# Patient Record
Sex: Female | Born: 1955 | Race: Black or African American | Hispanic: No | Marital: Single | Smoking: Never smoker
Health system: Southern US, Community
[De-identification: ages and names within clinical notes are randomized; demographics above are authoritative.]

## PROBLEM LIST (undated history)

## (undated) DIAGNOSIS — E119 Type 2 diabetes mellitus without complications: Secondary | ICD-10-CM

## (undated) DIAGNOSIS — I639 Cerebral infarction, unspecified: Secondary | ICD-10-CM

## (undated) DIAGNOSIS — I160 Hypertensive urgency: Secondary | ICD-10-CM

## (undated) HISTORY — PX: NO PAST SURGERIES: SHX2092

---

## 2017-05-14 ENCOUNTER — Encounter (HOSPITAL_COMMUNITY): Payer: Self-pay

## 2017-05-14 ENCOUNTER — Other Ambulatory Visit: Payer: Self-pay

## 2017-05-14 ENCOUNTER — Inpatient Hospital Stay (HOSPITAL_COMMUNITY)
Admission: EM | Admit: 2017-05-14 | Discharge: 2017-05-19 | DRG: 304 | Disposition: A | Discharge status: Home / Self Care | Payer: Self-pay | Attending: Family Medicine | Admitting: Family Medicine

## 2017-05-14 DIAGNOSIS — I160 Hypertensive urgency: Principal | ICD-10-CM | POA: Diagnosis present

## 2017-05-14 DIAGNOSIS — I10 Essential (primary) hypertension: Secondary | ICD-10-CM

## 2017-05-14 DIAGNOSIS — R51 Headache: Secondary | ICD-10-CM

## 2017-05-14 DIAGNOSIS — R739 Hyperglycemia, unspecified: Secondary | ICD-10-CM

## 2017-05-14 DIAGNOSIS — E1165 Type 2 diabetes mellitus with hyperglycemia: Secondary | ICD-10-CM | POA: Diagnosis present

## 2017-05-14 DIAGNOSIS — E611 Iron deficiency: Secondary | ICD-10-CM | POA: Diagnosis present

## 2017-05-14 DIAGNOSIS — G9389 Other specified disorders of brain: Secondary | ICD-10-CM | POA: Diagnosis present

## 2017-05-14 DIAGNOSIS — I129 Hypertensive chronic kidney disease with stage 1 through stage 4 chronic kidney disease, or unspecified chronic kidney disease: Secondary | ICD-10-CM | POA: Diagnosis present

## 2017-05-14 DIAGNOSIS — N185 Chronic kidney disease, stage 5: Secondary | ICD-10-CM | POA: Diagnosis present

## 2017-05-14 DIAGNOSIS — I6783 Posterior reversible encephalopathy syndrome: Secondary | ICD-10-CM | POA: Diagnosis present

## 2017-05-14 DIAGNOSIS — E1136 Type 2 diabetes mellitus with diabetic cataract: Secondary | ICD-10-CM | POA: Diagnosis present

## 2017-05-14 DIAGNOSIS — R519 Headache, unspecified: Secondary | ICD-10-CM

## 2017-05-14 DIAGNOSIS — H5462 Unqualified visual loss, left eye, normal vision right eye: Secondary | ICD-10-CM | POA: Diagnosis present

## 2017-05-14 DIAGNOSIS — R4182 Altered mental status, unspecified: Secondary | ICD-10-CM

## 2017-05-14 DIAGNOSIS — Z9114 Patient's other noncompliance with medication regimen: Secondary | ICD-10-CM

## 2017-05-14 DIAGNOSIS — Z8673 Personal history of transient ischemic attack (TIA), and cerebral infarction without residual deficits: Secondary | ICD-10-CM

## 2017-05-14 DIAGNOSIS — G40209 Localization-related (focal) (partial) symptomatic epilepsy and epileptic syndromes with complex partial seizures, not intractable, without status epilepticus: Secondary | ICD-10-CM | POA: Diagnosis present

## 2017-05-14 DIAGNOSIS — D649 Anemia, unspecified: Secondary | ICD-10-CM | POA: Diagnosis present

## 2017-05-14 DIAGNOSIS — H9190 Unspecified hearing loss, unspecified ear: Secondary | ICD-10-CM | POA: Diagnosis present

## 2017-05-14 DIAGNOSIS — E1122 Type 2 diabetes mellitus with diabetic chronic kidney disease: Secondary | ICD-10-CM | POA: Diagnosis present

## 2017-05-14 DIAGNOSIS — D329 Benign neoplasm of meninges, unspecified: Secondary | ICD-10-CM | POA: Diagnosis present

## 2017-05-14 DIAGNOSIS — R9401 Abnormal electroencephalogram [EEG]: Secondary | ICD-10-CM | POA: Diagnosis present

## 2017-05-14 HISTORY — DX: Cerebral infarction, unspecified: I63.9

## 2017-05-14 HISTORY — DX: Type 2 diabetes mellitus without complications: E11.9

## 2017-05-14 HISTORY — DX: Hypertensive urgency: I16.0

## 2017-05-14 LAB — COMPREHENSIVE METABOLIC PANEL
ALK PHOS: 59 U/L (ref 38–126)
ALT: 15 U/L (ref 14–54)
AST: 16 U/L (ref 15–41)
Albumin: 3.4 g/dL — ABNORMAL LOW (ref 3.5–5.0)
Anion gap: 10 (ref 5–15)
BILIRUBIN TOTAL: 0.9 mg/dL (ref 0.3–1.2)
BUN: 52 mg/dL — AB (ref 6–20)
CALCIUM: 9 mg/dL (ref 8.9–10.3)
CO2: 23 mmol/L (ref 22–32)
CREATININE: 1.63 mg/dL — AB (ref 0.44–1.00)
Chloride: 97 mmol/L — ABNORMAL LOW (ref 101–111)
GFR, EST AFRICAN AMERICAN: 38 mL/min — AB (ref >60)
GFR, EST NON AFRICAN AMERICAN: 33 mL/min — AB (ref >60)
Glucose, Bld: 385 mg/dL — ABNORMAL HIGH (ref 65–99)
Potassium: 4.4 mmol/L (ref 3.5–5.1)
Sodium: 130 mmol/L — ABNORMAL LOW (ref 135–145)
TOTAL PROTEIN: 8 g/dL (ref 6.5–8.1)

## 2017-05-14 LAB — CBC
HCT: 27.4 % — ABNORMAL LOW (ref 36.0–46.0)
Hemoglobin: 9.2 g/dL — ABNORMAL LOW (ref 12.0–15.0)
MCH: 27.7 pg (ref 26.0–34.0)
MCHC: 33.6 g/dL (ref 30.0–36.0)
MCV: 82.5 fL (ref 78.0–100.0)
PLATELETS: 386 10*3/uL (ref 150–400)
RBC: 3.32 MIL/uL — AB (ref 3.87–5.11)
RDW: 12.9 % (ref 11.5–15.5)
WBC: 9.2 10*3/uL (ref 4.0–10.5)

## 2017-05-14 LAB — CBG MONITORING, ED: Glucose-Capillary: 359 mg/dL — ABNORMAL HIGH (ref 65–99)

## 2017-05-14 MED ORDER — SODIUM CHLORIDE 0.9 % IV BOLUS (SEPSIS)
1000.0000 mL | Freq: Once | INTRAVENOUS | Status: AC
  Administered 2017-05-15: 1000 mL via INTRAVENOUS

## 2017-05-14 MED ORDER — FENTANYL CITRATE (PF) 100 MCG/2ML IJ SOLN
50.0000 ug | Freq: Once | INTRAMUSCULAR | Status: AC
  Administered 2017-05-15: 50 ug via INTRAVENOUS
  Filled 2017-05-14: qty 2

## 2017-05-14 MED ORDER — ONDANSETRON HCL 4 MG/2ML IJ SOLN
4.0000 mg | Freq: Once | INTRAMUSCULAR | Status: AC
  Administered 2017-05-15: 4 mg via INTRAVENOUS
  Filled 2017-05-14: qty 2

## 2017-05-14 MED ORDER — AMLODIPINE BESYLATE 5 MG PO TABS
5.0000 mg | ORAL_TABLET | Freq: Once | ORAL | Status: AC
  Administered 2017-05-15: 5 mg via ORAL
  Filled 2017-05-14: qty 1

## 2017-05-14 MED ORDER — INSULIN ASPART 100 UNIT/ML ~~LOC~~ SOLN
5.0000 [IU] | Freq: Once | SUBCUTANEOUS | Status: AC
  Administered 2017-05-15: 5 [IU] via SUBCUTANEOUS
  Filled 2017-05-14: qty 1

## 2017-05-14 NOTE — ED Triage Notes (Signed)
Onset  This morning headache.  Pt took Ibu with no relief.  Pt is visiting from [place], per daughter pt appears to be little confused today (pt confused exit door with bedroom door).  A&Ox 4.

## 2017-05-14 NOTE — ED Provider Notes (Signed)
TIME SEEN: 11:46 PM  CHIEF COMPLAINT: Headache, confusion  HPI: Patient is a 62 year old female with previous history of hypertension, diabetes, stroke who presents to the emergency department with her family for complaints of diffuse throbbing gradual onset headache that started this morning.  She presents here with her daughter who is also concerned because she has seemed confused today.  Patient is here visiting from [place].  She has been here for 2 months and plans to go home in March.  Patient denies any head injury.  Not on antiplatelets or anticoagulants.  She denies numbness, tingling or focal weakness.  Denies chest pain or shortness of breath.  Does report loss of vision in her left eye for the past 6 months and she feels like it seems "darker" for the past week.  No other vision changes.  No fevers, chills, cough, vomiting or diarrhea.  Tried ibuprofen without relief.  Patient is hypertensive and hyperglycemic here.  She states she is supposed to be on medication for hypertension and diabetes but left these medications in [place].  She cannot recall the name of these medications.  She also appears to have elevated kidney function here today.  She is not sure if this is chronic or not.  Daughter - Levada [phone]  ROS: See HPI Constitutional: no fever  Eyes: no drainage  ENT: no runny nose   Cardiovascular:  no chest pain  Resp: no SOB  GI: no vomiting GU: no dysuria Integumentary: no rash  Allergy: no hives  Musculoskeletal: no leg swelling  Neurological: no slurred speech ROS otherwise negative  PAST MEDICAL HISTORY/PAST SURGICAL HISTORY:  Past Medical History:  Diagnosis Date  . Diabetes mellitus without complication ([place])   . Stroke [facility])     MEDICATIONS:  Prior to Admission medications   Not on File    ALLERGIES:  No Known Allergies  SOCIAL HISTORY:  Social History   Tobacco Use  . Smoking status: Never Smoker  . Smokeless tobacco: Never Used  Substance  Use Topics  . Alcohol use: No    Frequency: Never    FAMILY HISTORY: History reviewed. No pertinent family history.  EXAM: BP (!) 190/97   Pulse 97   Temp 98.7 F (37.1 C)   Resp 14   SpO2 100%  CONSTITUTIONAL: Alert and oriented x3 and responds appropriately to questions.  Thin, elderly, poor historian, hard of hearing HEAD: Normocephalic, atraumatic EYES: Conjunctivae clear, pupils appear equal, EOMI, reports decreased vision in the left eye compared to the right but patient has a hard time understanding commands to accurately check visual fields ENT: normal nose; moist mucous membranes NECK: Supple, no meningismus, no nuchal rigidity, no LAD  CARD: RRR; S1 and S2 appreciated; no murmurs, no clicks, no rubs, no gallops RESP: Normal chest excursion without splinting or tachypnea; breath sounds clear and equal bilaterally; no wheezes, no rhonchi, no rales, no hypoxia or respiratory distress, speaking full sentences ABD/GI: Normal bowel sounds; non-distended; soft, non-tender, no rebound, no guarding, no peritoneal signs, no hepatosplenomegaly BACK:  The back appears normal and is non-tender to palpation, there is no CVA tenderness EXT: Normal ROM in all joints; non-tender to palpation; no edema; normal capillary refill; no cyanosis, no calf tenderness or swelling    SKIN: Normal color for age and race; warm; no rash NEURO: Moves all extremities equally, strength 5/5 in all 4 extremities but she does have difficulty figuring out how to follow commands with the left upper extremity, sensation to light touch  intact diffusely, cranial nerves II through XII intact, no dysmetria to finger-nose testing bilaterally, normal speech PSYCH: The patient's mood and manner are appropriate. Grooming and personal hygiene are appropriate.  MEDICAL DECISION MAKING: Patient here with gradual onset throbbing frontal headache and acute on chronic vision changes and confusion.  Labs show elevated creatinine of  1.63, hyperglycemia with glucose of 385 but not in DKA.  She also also anemic with hemoglobin of 9.2.  No leukocytosis.  I do not think this is meningitis or encephalitis.  I am concerned that she could have acute stroke.  Unable to determine the last seen normal.  Seems like symptoms of headache started this morning as well as confusion but vision changes have been worsening for the past 6 months.  Will obtain head CT and give pain and nausea medicine.  Will also give medication for blood pressure and insulin to bring down her blood glucose.  ED PROGRESS: Head CT current starting for multiple areas of low attenuation that could represent stroke.  Recommend MRI of the brain.  Will discuss with neurology.  Blood pressure slowly improving.  Allowing for permissive hypertension in case this is stroke.   1:12 AM  D/w Dr. Cheral Marker with neurology.  He will see patient in consult.  He agrees with MRI of the brain without contrast.  He is concerned this could be PRES. recommends more aggressive blood pressure control.  Will give IV hydralazine.  Patient reports her headache is improved with fentanyl.  Will discuss with medicine for admission for control of blood pressure, hyperglycemia and admission for possible PRES versus CVA.   1:28 AM Discussed patient's case with hospitalist, Dr. Hal Hope.  I have recommended admission and patient (and family if present) agree with this plan. Admitting physician will place admission orders.   I reviewed all nursing notes, vitals, pertinent previous records, EKGs, lab and urine results, imaging (as available).     EKG Interpretation  Date/Time:  Wednesday May 15 2017 00:53:41 EST Ventricular Rate:  97 PR Interval:    QRS Duration: 79 QT Interval:  346 QTC Calculation: 440 R Axis:   60 Text Interpretation:  Sinus rhythm Biatrial enlargement Anterior infarct, old No old tracing to compare Confirmed by Juliene Kirsh, Cyril Mourning [phone]) on 05/15/2017 1:13:22 AM          Vannie Hilgert, Delice Bison, DO 05/15/17 0129

## 2017-05-15 ENCOUNTER — Encounter (HOSPITAL_COMMUNITY): Payer: Self-pay | Admitting: Radiology

## 2017-05-15 ENCOUNTER — Observation Stay (HOSPITAL_COMMUNITY): Payer: Self-pay

## 2017-05-15 ENCOUNTER — Emergency Department (HOSPITAL_COMMUNITY): Payer: Self-pay

## 2017-05-15 DIAGNOSIS — R4182 Altered mental status, unspecified: Secondary | ICD-10-CM

## 2017-05-15 DIAGNOSIS — E1165 Type 2 diabetes mellitus with hyperglycemia: Secondary | ICD-10-CM | POA: Diagnosis present

## 2017-05-15 DIAGNOSIS — D649 Anemia, unspecified: Secondary | ICD-10-CM | POA: Diagnosis present

## 2017-05-15 DIAGNOSIS — I160 Hypertensive urgency: Secondary | ICD-10-CM

## 2017-05-15 HISTORY — DX: Hypertensive urgency: I16.0

## 2017-05-15 LAB — HEMOGLOBIN A1C
Hgb A1c MFr Bld: 13.3 % — ABNORMAL HIGH (ref 4.8–5.6)
Mean Plasma Glucose: 335.01 mg/dL

## 2017-05-15 LAB — RETICULOCYTES
RBC.: 2.95 MIL/uL — ABNORMAL LOW (ref 3.87–5.11)
RETIC CT PCT: 1.4 % (ref 0.4–3.1)
Retic Count, Absolute: 41.3 10*3/uL (ref 19.0–186.0)

## 2017-05-15 LAB — CBG MONITORING, ED
GLUCOSE-CAPILLARY: 264 mg/dL — AB (ref 65–99)
GLUCOSE-CAPILLARY: 211 mg/dL — AB (ref 65–99)
GLUCOSE-CAPILLARY: 279 mg/dL — AB (ref 65–99)
Glucose-Capillary: 351 mg/dL — ABNORMAL HIGH (ref 65–99)
Glucose-Capillary: 244 mg/dL — ABNORMAL HIGH (ref 65–99)

## 2017-05-15 LAB — BASIC METABOLIC PANEL
ANION GAP: 7 (ref 5–15)
BUN: 40 mg/dL — ABNORMAL HIGH (ref 6–20)
CO2: 20 mmol/L — AB (ref 22–32)
Calcium: 8.7 mg/dL — ABNORMAL LOW (ref 8.9–10.3)
Chloride: 109 mmol/L (ref 101–111)
Creatinine, Ser: 1.33 mg/dL — ABNORMAL HIGH (ref 0.44–1.00)
GFR calc Af Amer: 49 mL/min — ABNORMAL LOW (ref >60)
GFR, EST NON AFRICAN AMERICAN: 42 mL/min — AB (ref >60)
GLUCOSE: 188 mg/dL — AB (ref 65–99)
POTASSIUM: 4.1 mmol/L (ref 3.5–5.1)
Sodium: 136 mmol/L (ref 135–145)

## 2017-05-15 LAB — SODIUM, URINE, RANDOM: SODIUM UR: 38 mmol/L

## 2017-05-15 LAB — URINALYSIS, ROUTINE W REFLEX MICROSCOPIC
Bacteria, UA: NONE SEEN
Bilirubin Urine: NEGATIVE
Glucose, UA: >=500 mg/dL — AB
KETONES UR: 5 mg/dL — AB
Leukocytes, UA: NEGATIVE
Nitrite: NEGATIVE
PROTEIN: 100 mg/dL — AB
Specific Gravity, Urine: 1.014 (ref 1.005–1.030)
pH: 5 (ref 5.0–8.0)

## 2017-05-15 LAB — FERRITIN: FERRITIN: 164 ng/mL (ref 11–307)

## 2017-05-15 LAB — HIV ANTIBODY (ROUTINE TESTING W REFLEX): HIV Screen 4th Generation wRfx: NONREACTIVE

## 2017-05-15 LAB — CREATININE, URINE, RANDOM: CREATININE, URINE: 43.79 mg/dL

## 2017-05-15 LAB — IRON AND TIBC
IRON: 37 ug/dL (ref 28–170)
Saturation Ratios: 15 % (ref 10.4–31.8)
TIBC: 244 ug/dL — AB (ref 250–450)
UIBC: 207 ug/dL

## 2017-05-15 LAB — FOLATE: Folate: 21 ng/mL (ref >5.9)

## 2017-05-15 LAB — SEDIMENTATION RATE: Sed Rate: 80 mm/hr — ABNORMAL HIGH (ref 0–22)

## 2017-05-15 LAB — VITAMIN B12: Vitamin B-12: 996 pg/mL — ABNORMAL HIGH (ref 180–914)

## 2017-05-15 MED ORDER — ACETAMINOPHEN 160 MG/5ML PO SOLN: 650.0000 mg | ORAL | Status: DC | PRN

## 2017-05-15 MED ORDER — ONDANSETRON HCL 4 MG/2ML IJ SOLN
4.0000 mg | INTRAMUSCULAR | Status: DC | PRN
  Administered 2017-05-15: 4 mg via INTRAVENOUS
  Filled 2017-05-15: qty 2

## 2017-05-15 MED ORDER — INSULIN ASPART 100 UNIT/ML ~~LOC~~ SOLN
0.0000 [IU] | SUBCUTANEOUS | Status: DC
  Administered 2017-05-15: 5 [IU] via SUBCUTANEOUS
  Filled 2017-05-15: qty 1

## 2017-05-15 MED ORDER — INSULIN ASPART 100 UNIT/ML ~~LOC~~ SOLN
0.0000 [IU] | Freq: Three times a day (TID) | SUBCUTANEOUS | Status: DC
  Administered 2017-05-15: 3 [IU] via SUBCUTANEOUS
  Administered 2017-05-15: 5 [IU] via SUBCUTANEOUS
  Administered 2017-05-16: 3 [IU] via SUBCUTANEOUS
  Administered 2017-05-16: 2 [IU] via SUBCUTANEOUS
  Administered 2017-05-17: 3 [IU] via SUBCUTANEOUS
  Administered 2017-05-17: 2 [IU] via SUBCUTANEOUS
  Administered 2017-05-18: 1 [IU] via SUBCUTANEOUS
  Administered 2017-05-18: 3 [IU] via SUBCUTANEOUS
  Administered 2017-05-18: 2 [IU] via SUBCUTANEOUS
  Administered 2017-05-19 (×3): 1 [IU] via SUBCUTANEOUS
  Filled 2017-05-15 (×2): qty 1

## 2017-05-15 MED ORDER — ACETAMINOPHEN 650 MG RE SUPP: 650.0000 mg | RECTAL | Status: DC | PRN

## 2017-05-15 MED ORDER — LEVETIRACETAM 500 MG/5ML IV SOLN
1000.0000 mg | Freq: Once | INTRAVENOUS | Status: AC
  Administered 2017-05-15: 1000 mg via INTRAVENOUS
  Filled 2017-05-15: qty 10

## 2017-05-15 MED ORDER — ACETAMINOPHEN 325 MG PO TABS
650.0000 mg | ORAL_TABLET | ORAL | Status: DC | PRN
  Filled 2017-05-15: qty 2

## 2017-05-15 MED ORDER — SODIUM CHLORIDE 0.9 % IV SOLN
500.0000 mg | Freq: Two times a day (BID) | INTRAVENOUS | Status: DC
  Administered 2017-05-16 – 2017-05-17 (×3): 500 mg via INTRAVENOUS
  Filled 2017-05-15 (×3): qty 5

## 2017-05-15 MED ORDER — HYDRALAZINE HCL 20 MG/ML IJ SOLN
10.0000 mg | Freq: Once | INTRAMUSCULAR | Status: AC
  Administered 2017-05-15: 10 mg via INTRAVENOUS
  Filled 2017-05-15: qty 1

## 2017-05-15 MED ORDER — INSULIN GLARGINE 100 UNIT/ML ~~LOC~~ SOLN
10.0000 [IU] | Freq: Every day | SUBCUTANEOUS | Status: DC
  Administered 2017-05-15: 10 [IU] via SUBCUTANEOUS
  Filled 2017-05-15 (×2): qty 0.1

## 2017-05-15 MED ORDER — HYDRALAZINE HCL 20 MG/ML IJ SOLN: 10.0000 mg | INTRAMUSCULAR | Status: DC | PRN

## 2017-05-15 MED ORDER — SODIUM CHLORIDE 0.9 % IV SOLN
INTRAVENOUS | Status: DC
Start: 2017-05-15 — End: 2017-05-16
  Administered 2017-05-15 (×2): via INTRAVENOUS

## 2017-05-15 MED ORDER — AMLODIPINE BESYLATE 5 MG PO TABS
5.0000 mg | ORAL_TABLET | Freq: Every day | ORAL | Status: DC
  Administered 2017-05-15: 5 mg via ORAL
  Filled 2017-05-15: qty 1

## 2017-05-15 NOTE — ED Notes (Signed)
Patient transported to CT 

## 2017-05-15 NOTE — Progress Notes (Signed)
Patient admitted after midnight, please see H&P.  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  Visual issues have been ongoing for 6 months but worsened over 1 month-- suspect related to uncontrolled blood sugars.  Will see about outpatient opthalmology evaluation.  Was on both BP meds and DM meds in [place] but stopped when came to the [place].  Spoke with daughter here who is not sure what medications patient was supposed to be taking but will contact her sister to find out.  Eulogio Bear DO

## 2017-05-15 NOTE — ED Notes (Signed)
CBG 279 

## 2017-05-15 NOTE — Evaluation (Signed)
Physical Therapy Evaluation Patient Details Name: Linda Dennis MRN: [id number] DOB: 08/19/1955 Today's Date: 05/15/2017   History of Present Illness  Pt is a 62 y/o female admited secondary to hypertensive urgency. Pt with increased confusion. 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 Also revealed R temporal occipital meningioma. PMH includes CVA, L vision loss, and DM.   Clinical Impression  Pt admitted secondary to problem above with deficits below. PTA, pt reports she needed occasional assist for mobility secondary to L eye visual deficits, however, did not use AD. Upon eval, pt presenting with decreased balance, weakness, and L eye visual deficits. Required min guard to min A for mobility without AD. Educated about use of RW at home to increase independence with functional mobility. Pt would benefit from HHPT, however, unsure if pt will be eligible. Unsure of family assist able to be provided at home, so will need to ensure family can provide assist as well. Will continue to follow acutely to maximize functional mobility independence and safety.     Follow Up Recommendations Supervision for mobility/OOB;Other (comment)(would benefit from HHPT; unsure if pt is eligible )    Equipment Recommendations  Rolling walker with 5" wheels;3in1 (PT)    Recommendations for Other Services       Precautions / Restrictions Precautions Precautions: Fall Restrictions Weight Bearing Restrictions: No      Mobility  Bed Mobility Overal bed mobility: Modified Independent             General bed mobility comments: Increased time, however, no assist required.   Transfers Overall transfer level: Needs assistance Equipment used: None Transfers: Sit to/from Stand Sit to Stand: Min guard         General transfer comment: Min guard for steadying assist.   Ambulation/Gait Ambulation/Gait assistance: Min guard;Min assist Ambulation Distance (Feet):  125 Feet Assistive device: None Gait Pattern/deviations: Step-through pattern;Decreased stride length;Drifts right/left Gait velocity: Decreased Gait velocity interpretation: Below normal speed for age/gender General Gait Details: Slow, unsteady gait without use of AD. Required min to min guard assist for mobility. Educated about using RW at home to increase safety with mobility. Pt with L sided visual deficits as well, so required directional cues to avoid obstacles on the L.   Stairs            Wheelchair Mobility    Modified Rankin (Stroke Patients Only) Modified Rankin (Stroke Patients Only) Pre-Morbid Rankin Score: Slight disability Modified Rankin: Moderately severe disability     Balance Overall balance assessment: Needs assistance Sitting-balance support: No upper extremity supported;Feet supported Sitting balance-Leahy Scale: Good     Standing balance support: No upper extremity supported;During functional activity Standing balance-Leahy Scale: Fair                               Pertinent Vitals/Pain Pain Assessment: Faces Faces Pain Scale: Hurts little more Pain Location: head  Pain Descriptors / Indicators: Headache Pain Intervention(s): Limited activity within patient's tolerance;Monitored during session;Repositioned    Home Living Family/patient expects to be discharged to:: Private residence Living Arrangements: Children Available Help at Discharge: Family Type of Home: Apartment Home Access: Stairs to enter Entrance Stairs-Rails: Chemical engineer of Steps: flight  Home Layout: One level Home Equipment: None Additional Comments: Pt lives in [place], however, is staying with her children while she is here. Unsure of specific details and family assist able to be provided upon  d/c as no family present.     Prior Function Level of Independence: Needs assistance   Gait / Transfers Assistance Needed: Pt reports she needed  some help getting around secondary to L visual deficits, however, did not use AD.            Hand Dominance        Extremity/Trunk Assessment   Upper Extremity Assessment Upper Extremity Assessment: Defer to OT evaluation    Lower Extremity Assessment Lower Extremity Assessment: Generalized weakness    Cervical / Trunk Assessment Cervical / Trunk Assessment: Kyphotic  Communication   Communication: No difficulties  Cognition Arousal/Alertness: Awake/alert Behavior During Therapy: WFL for tasks assessed/performed Overall Cognitive Status: No family/caregiver present to determine baseline cognitive functioning                                        General Comments General comments (skin integrity, edema, etc.): No family present. Will need to follow up with family to ensure they are able to provide assist at home.     Exercises     Assessment/Plan    PT Assessment Patient needs continued PT services  PT Problem List Decreased strength;Decreased balance;Decreased mobility;Decreased knowledge of use of DME;Decreased knowledge of precautions;Pain       PT Treatment Interventions Gait training;Stair training;Functional mobility training;Therapeutic activities;DME instruction;Therapeutic exercise;Balance training;Neuromuscular re-education;Patient/family education    PT Goals (Current goals can be found in the Care Plan section)  Acute Rehab PT Goals Patient Stated Goal: none stated  PT Goal Formulation: With patient Time For Goal Achievement: 05/29/17 Potential to Achieve Goals: Good    Frequency Min 3X/week   Barriers to discharge Other (comment) unsure of caregiver support.     Co-evaluation               AM-PAC PT "6 Clicks" Daily Activity  Outcome Measure Difficulty turning over in bed (including adjusting bedclothes, sheets and blankets)?: None Difficulty moving from lying on back to sitting on the side of the bed? : A  Little Difficulty sitting down on and standing up from a chair with arms (e.g., wheelchair, bedside commode, etc,.)?: Unable Help needed moving to and from a bed to chair (including a wheelchair)?: A Little Help needed walking in hospital room?: A Little Help needed climbing 3-5 steps with a railing? : A Lot 6 Click Score: 16    End of Session Equipment Utilized During Treatment: Gait belt Activity Tolerance: Patient tolerated treatment well Patient left: in bed;with call bell/phone within reach Nurse Communication: Mobility status PT Visit Diagnosis: Unsteadiness on feet (R26.81);Other abnormalities of gait and mobility (R26.89);Muscle weakness (generalized) (M62.81)    Time: 1643-1700 PT Time Calculation (min) (ACUTE ONLY): 17 min   Charges:   PT Evaluation $PT Eval Moderate Complexity: 1 Mod     PT G Codes:        Leighton Ruff, PT, DPT  Acute Rehabilitation Services  Pager: [phone]   Rudean Hitt 05/15/2017, 5:28 PM

## 2017-05-15 NOTE — ED Notes (Signed)
Ordered breakfast tray  

## 2017-05-15 NOTE — Procedures (Signed)
ELECTROENCEPHALOGRAM REPORT  Date of Study: 05/15/2017  Patient's Name: Linda Dennis MRN: [id number] Date of Birth: February 10, 1956  Referring Provider: Dr. Kerney Elbe  Clinical History: This is a 62 year old woman with altered mental status, headache, vision loss, veering to the left.  Medications: Tylenol Norvasc Hydralazine Insulin Zofran  Technical Summary: A multichannel digital EEG recording measured by the international 10-20 system with electrodes applied with paste and impedances below 5000 ohms performed as portable with EKG monitoring in an awake and drowsy patient.  Hyperventilation was not performed. Photic stimulation was performed.  The digital EEG was referentially recorded, reformatted, and digitally filtered in a variety of bipolar and referential montages for optimal display.   Description: The patient is awake and drowsy during the recording.  During maximal wakefulness, there is an asymmetric, medium voltage 7 Hz posterior dominant rhythm better formed over the left hemisphere, that attenuates with eye opening. This is admixed with a moderate amount of diffuse 5-6 Hz theta slowing of the waking background. There is additional focal slowing and loss of faster frequencies over the right hemisphere. There are lateralized periodic discharges (LPDs) seen over the right hemisphere, maximal over the right frontocentral region, at times with spread to the left hemisphere. LPDs occur every 1-3 second, with no evolution in frequency or amplitude. Normal sleep architecture was not seen. Photic stimulation did not elicit any abnormalities.  There were no electrographic seizures seen.    EKG lead showed sinus tachycardia.  Impression: This awake and drowsy EEG is abnormal due to the presence of: 1. Mild diffuse slowing of the waking background 2. Additional focal slowing over the right hemisphere 3. Lateralized periodic discharges over the right hemisphere, maximal over the right  frontocentral region  Clinical Correlation of the above findings indicates diffuse cerebral dysfunction that is non-specific in etiology and can be seen with hypoxic/ischemic injury, toxic/metabolic encephalopathies, neurodegenerative disorders, or medication effect. Focal slowing over the right hemisphere indicates focal cerebral dysfunction in this regions, suggestive of underlying structural or physiologic abnormality. Although lateralized periodic discharges do not represent ongoing seizure activity, they are commonly associated with an acute brain lesion and clinical focal seizures, or post-ictal after focal status epilepticus.    Ellouise Newer, M.D.

## 2017-05-15 NOTE — ED Notes (Signed)
Pt is alert and oriented and transferred to bed.  Pt set up to eat.  Pt on monitor. NS at 75.  Pt follows commands.  Alert to self and in hospital.

## 2017-05-15 NOTE — H&P (Signed)
History and Physical    Linda Dennis WRU:[id number] DOB: [birth year] DOA: 05/14/2017  PCP: Patient, No Pcp Per  Patient coming from: Home.  Chief Complaint: Headache.  HPI: Linda Dennis is a 62 y.o. female with history of hypertension diabetes mellitus who originally resides in [place] is visiting her family and has been in the [place] since March 16, 2017.  Patient has not brought her medications from home.  And has not taken any of her medications for last 2 months.  Since yesterday morning patient states she has been having a frontal headache throbbing in nature with some nausea vomiting.  At times she felt weak in the extremities but has not lost function of the extremities.  Patient also states that she has poor vision in the left eye.  As per the daughter who discussed with the ER physician patient also looked confused.  ED Course: On arrival patient's blood pressure was more than 914 systolic with labs showing blood sugar in the 300 range.  Not in DKA.  CT head is unremarkable and patient on exam has poor vision on the left eye which patient states is chronic and also has a cataract in the left eye.  Patient denies any chest pain or shortness of breath.  Is able to move all extremities.  On-call neurology has been consulted.  Since her main concern is patient may be having hypertensive urgency patient was given amlodipine followed by hydralazine.  Review of Systems: As per HPI, rest all negative.   Past Medical History:  Diagnosis Date  . Diabetes mellitus without complication ([place])   . Stroke [facility])     History reviewed. No pertinent surgical history.   reports that  has never smoked. she has never used smokeless tobacco. She reports that she does not drink alcohol or use drugs.  No Known Allergies  Family History  Family history unknown: Yes    Prior to Admission medications   Not on File    Physical Exam: Vitals:   05/15/17 0030 05/15/17 0045 05/15/17 0115 05/15/17 0130    BP: (!) 172/95 (!) 170/94 (!) 185/95 (!) 179/100  Pulse: 99 97 (!) 101 99  Resp: 16 17 16 19   Temp:      TempSrc:      SpO2: 100% 100% 100% 100%      Constitutional: Moderately built and nourished. Vitals:   05/15/17 0030 05/15/17 0045 05/15/17 0115 05/15/17 0130  BP: (!) 172/95 (!) 170/94 (!) 185/95 (!) 179/100  Pulse: 99 97 (!) 101 99  Resp: 16 17 16 19   Temp:      TempSrc:      SpO2: 100% 100% 100% 100%   Eyes: Anicteric no pallor.  Left eye has a cataract. ENMT: No discharge from the ears eyes nose or mouth. Neck: No mass felt.  No neck rigidity. Respiratory: No rhonchi or crepitations. Cardiovascular: S1-S2 heard. Abdomen: Soft nontender bowel sounds present. Musculoskeletal: No edema.  No joint effusion. Skin: No rash.  Skin appears warm. Neurologic: Alert awake oriented to time place and person.  Moves all extremities 5 x 5.  No facial asymmetry.  Tongue is midline.  Poor vision on the left eye.  Pupils are reacting.  Psychiatric: Appears normal.   Labs on Admission: I have personally reviewed following labs and imaging studies  CBC: Recent Labs  Lab 05/14/17 2015  WBC 9.2  HGB 9.2*  HCT 27.4*  MCV 82.5  PLT 782   Basic Metabolic Panel: Recent Labs  Lab  05/14/17 2015  NA 130*  K 4.4  CL 97*  CO2 23  GLUCOSE 385*  BUN 52*  CREATININE 1.63*  CALCIUM 9.0   GFR: CrCl cannot be calculated (Unknown ideal weight.). Liver Function Tests: Recent Labs  Lab 05/14/17 2015  AST 16  ALT 15  ALKPHOS 59  BILITOT 0.9  PROT 8.0  ALBUMIN 3.4*   No results for input(s): LIPASE, AMYLASE in the last 168 hours. No results for input(s): AMMONIA in the last 168 hours. Coagulation Profile: No results for input(s): INR, PROTIME in the last 168 hours. Cardiac Enzymes: No results for input(s): CKTOTAL, CKMB, CKMBINDEX, TROPONINI in the last 168 hours. BNP (last 3 results) No results for input(s): PROBNP in the last 8760 hours. HbA1C: No results for input(s):  HGBA1C in the last 72 hours. CBG: Recent Labs  Lab 05/14/17 2010 05/15/17 0106  GLUCAP 359* 351*   Lipid Profile: No results for input(s): CHOL, HDL, LDLCALC, TRIG, CHOLHDL, LDLDIRECT in the last 72 hours. Thyroid Function Tests: No results for input(s): TSH, T4TOTAL, FREET4, T3FREE, THYROIDAB in the last 72 hours. Anemia Panel: No results for input(s): VITAMINB12, FOLATE, FERRITIN, TIBC, IRON, RETICCTPCT in the last 72 hours. Urine analysis: No results found for: COLORURINE, APPEARANCEUR, LABSPEC, PHURINE, GLUCOSEU, HGBUR, BILIRUBINUR, KETONESUR, PROTEINUR, UROBILINOGEN, NITRITE, LEUKOCYTESUR Sepsis Labs: @LABRCNTIP (procalcitonin:4,lacticidven:4) )No results found for this or any previous visit (from the past 240 hour(s)).   Radiological Exams on Admission: Ct Head Wo Contrast  Result Date: 05/15/2017 CLINICAL DATA:  Headache beginning this morning. Confusion. History of stroke and diabetes. EXAM: CT HEAD WITHOUT CONTRAST TECHNIQUE: Contiguous axial images were obtained from the base of the skull through the vertex without intravenous contrast. COMPARISON:  None. FINDINGS: Brain: Mild cerebral atrophy. No ventricular dilatation. Low-attenuation changes in the deep white matter are nonspecific but likely due to small vessel ischemia. Focal areas of low-attenuation demonstrated in a right posterior parietal lobe and left frontal and parietal lobes. These could represent old infarcts but margins are not well defined and acute infarct and 1 or more locations could also be present. MRI would be more sensitive for evaluation of acute ischemia. No mass effect or midline shift. No abnormal extra-axial fluid collections. Gray-white matter junctions are distinct. Basal cisterns are not effaced. No acute intracranial hemorrhage. There is a nodular extra-axial calcification along the right posterior parietal dural surface measuring 1.5 cm diameter, likely representing a calcified meningioma. Vascular:  Internal carotid artery vascular calcifications are present. Skull: Normal. Negative for fracture or focal lesion. Sinuses/Orbits: No acute finding. 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 IMPRESSION: 1. Focal areas of low-attenuation in the right posterior parietal lobe and left frontal and parietal lobes. These could represent acute or chronic ischemic changes. MRI suggested for more definitive evaluation if clinically indicated. 2. No acute intracranial hemorrhage. 3. Mild cerebral atrophy and small vessel ischemic changes. 4. 15 mm right parietal calcified meningioma. Electronically Signed   By: Lucienne Capers M.D.   On: 05/15/2017 00:21    EKG: Independently reviewed.  Normal sinus rhythm.  Assessment/Plan Active Problems:   Hypertensive urgency   Uncontrolled type 2 diabetes mellitus with hyperglycemia (HCC)   Normocytic normochromic anemia    1. Hypertensive urgency -patient has been started on amlodipine 5 mg and as needed IV hydralazine for now.  Closely follow blood pressure trends.  In the morning once patient's family is available will need to check if we can get patient's home medication doses.  MRI  brain is pending to rule out any stroke. 2. Diabetes mellitus type 2 uncontrolled -patient was given 8 units of NovoLog subcu in the ER and I have placed patient on sliding scale coverage.  Check hemoglobin A1c and follow sliding scale coverage.  Need to check home medication doses. 3. Headache probably from uncontrolled blood pressure -presently has improved.  Closely monitor blood pressure trends and any worsening of headache.  MRI brain is pending to rule out any stroke.  Will check sed rate. 4. Normocytic normochromic anemia -no old labs to compare.  Follow CBC and anemia panel. 5. Elevated creatinine probably chronic kidney disease -again no old labs to compare.  Follow UA and FENa.  Follow metabolic panel.  Patient is a poor historian.  Will need to  discuss with patient's daughter in the morning again to get a more detailed history. I did discuss with on-call neurologist.   DVT prophylaxis: SCDs until blood pressure is controlled. Code Status: Full code. Family Communication: Discussed with patient. Disposition Plan: Home. Consults called: Neurology. Admission status: Observation.   Rise Patience MD [facility] Pager [phone].  If 7PM-7AM, please contact night-coverage [email or web address] Password [email or web address]  05/15/2017, 1:55 AM

## 2017-05-15 NOTE — Progress Notes (Signed)
EEG complete - results pending 

## 2017-05-15 NOTE — ED Notes (Signed)
Patient transported to MRI 

## 2017-05-15 NOTE — Consult Note (Signed)
NEURO HOSPITALIST CONSULT NOTE   Requestig physician: Dr. Leonides Schanz  Reason for Consult: Confusion, headache, vision loss  History obtained from:  Patient, Granddaughter and Chart     HPI:                                                                                                                                          Linda Dennis is an 62 y.o. female who presents with a 1 day history of confusion, severe headache (it made her cry), vision loss and veering to the left when walking. On presentation to the ED, her BP was elevated at 181/96. She denies having a prior history of stroke. She is visiting family from [place] and has been here for 2 weeks. Before leaving [place] she was on one or more antihypertensive medications; she states that she did not bring them with her to the [place] At baseline she walks normally and converses without confusion or aphasia as indicated by normal behavior while on shopping trips with family over the past two weeks - the above symptoms are definitely new as per granddaughter she has been observed to be normal for the past 2 weeks prior to deteriorating the day PTA.   Hospitalist note was reviewed: "Patient is a 62 year old female with previous history of hypertension, diabetes, stroke who presents to the emergency department with her family for complaints of diffuse throbbing gradual onset headache that started this morning.  She presents here with her daughter who is also concerned because she has seemed confused today.  Patient is here visiting from [place].  She has been here for 2 months and plans to go home in March.  Patient denies any head injury.  Not on antiplatelets or anticoagulants.  She denies numbness, tingling or focal weakness.  Denies chest pain or shortness of breath.  Does report loss of vision in her left eye for the past 6 months and she feels like it seems "darker" for the past week.  No other vision changes.  No fevers, chills,  cough, vomiting or diarrhea.  Tried ibuprofen without relief.  Patient is hypertensive and hyperglycemic here.  She states she is supposed to be on medication for hypertension and diabetes but left these medications in [place].  She cannot recall the name of these medications.  She also appears to have elevated kidney function here today.  She is not sure if this is chronic or not."  MRI brain: 1. No acute intracranial abnormality. 2. 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 3. Patchy white matter changes involving the supratentorial cerebral white matter, nonspecific, but most commonly related to chronic small vessel ischemic disease. 4. 13 mm right temporal occipital meningioma without associated mass effect.  Past Medical History:  Diagnosis Date  . Diabetes mellitus without complication ([place])   . Stroke [facility])     History reviewed. No pertinent surgical history.  History reviewed. No pertinent family history.  Social History:  reports that  has never smoked. she has never used smokeless tobacco. She reports that she does not drink alcohol or use drugs.  No Known Allergies  MEDICATIONS:                                                                                                                     Prior to Admission:  (Not in a hospital admission) Scheduled: . amLODipine  5 mg Oral Daily  . insulin aspart  0-9 Units Subcutaneous TID WC     ROS:                                                                                                                                       As per HPI.    Blood pressure (!) 170/94, pulse 97, temperature 98.7 F (37.1 C), resp. rate 17, SpO2 100 %.   General Examination:                                                                                                      HEENT-  [place]/AT    Lungs- Respirations unlabored Extremities- No edema  Neurological  Examination Mental Status: Alert, fully oriented, thought content appropriate. Speech fluent in the context of [place] accent and native language being a mixed Creole-English variation per granddaughter. Able to follow all right sided commands without difficulty but has difficulty attending to and following left sided commands. Mild left hemineglect.  Cranial Nerves: II: PERRL. Sees only dark OS. Right eye with left hemifield deficit and decreased acuity in the right hemifield.  III,IV, VI: Squints left eye but able to fully open. Initial EOM testing normal. Subsequently with about 20 seconds of coarse left-beating nystagmus than did not change when gazing to right, left or vertically; this  may have been accompanied by transient worsening of patient's ability to follow commands. This then resolved with normal EOM again seen.  V,VII: Smile symmetric, facial temp and FT sensation normal bilaterally without extinction to DSS.  VIII: Hearing intact to voice IX,X: no hypophonia or hoarseness XI: Lag on left with shoulder shrug XII: midline tongue extension Motor: RUE and RLE: Normal response to motor commands with 5/5 strength proximal and distal LUE and LLE: Left hemineglect when asked to perform movements, requiring frequent repetition of commands and redirection. Left arm drifts when held horizontally. Significant lag on left when asked to raise both arms simultaneously. 4-/5 to 4+/5 strength LUE and LLE on separate trials.  Sensory: Temp sensation intact all 4 extremities. FT decreased LUE and LLE with extinction to DSS.  Deep Tendon Reflexes:  1+ bilateral upper extremities.  Hypoactive lower extremities. Cerebellar: No ataxia with FNF on right. Has difficulty performing on left.  Gait: Deferred  Lab Results: Basic Metabolic Panel: Recent Labs  Lab 05/14/17 2015  NA 130*  K 4.4  CL 97*  CO2 23  GLUCOSE 385*  BUN 52*  CREATININE 1.63*  CALCIUM 9.0    Liver Function Tests: Recent  Labs  Lab 05/14/17 2015  AST 16  ALT 15  ALKPHOS 59  BILITOT 0.9  PROT 8.0  ALBUMIN 3.4*   No results for input(s): LIPASE, AMYLASE in the last 168 hours. No results for input(s): AMMONIA in the last 168 hours.  CBC: Recent Labs  Lab 05/14/17 2015  WBC 9.2  HGB 9.2*  HCT 27.4*  MCV 82.5  PLT 386    Cardiac Enzymes: No results for input(s): CKTOTAL, CKMB, CKMBINDEX, TROPONINI in the last 168 hours.  Lipid Panel: No results for input(s): CHOL, TRIG, HDL, CHOLHDL, VLDL, LDLCALC in the last 168 hours.  CBG: Recent Labs  Lab 05/14/17 2010 05/15/17 0106  GLUCAP 359* 351*    Microbiology: No results found for this or any previous visit.  Coagulation Studies: No results for input(s): LABPROT, INR in the last 72 hours.  Imaging: Ct Head Wo Contrast  Result Date: 05/15/2017 CLINICAL DATA:  Headache beginning this morning. Confusion. History of stroke and diabetes. EXAM: CT HEAD WITHOUT CONTRAST TECHNIQUE: Contiguous axial images were obtained from the base of the skull through the vertex without intravenous contrast. COMPARISON:  None. FINDINGS: Brain: Mild cerebral atrophy. No ventricular dilatation. Low-attenuation changes in the deep white matter are nonspecific but likely due to small vessel ischemia. Focal areas of low-attenuation demonstrated in a right posterior parietal lobe and left frontal and parietal lobes. These could represent old infarcts but margins are not well defined and acute infarct and 1 or more locations could also be present. MRI would be more sensitive for evaluation of acute ischemia. No mass effect or midline shift. No abnormal extra-axial fluid collections. Gray-white matter junctions are distinct. Basal cisterns are not effaced. No acute intracranial hemorrhage. There is a nodular extra-axial calcification along the right posterior parietal dural surface measuring 1.5 cm diameter, likely representing a calcified meningioma. Vascular: Internal carotid  artery vascular calcifications are present. Skull: Normal. Negative for fracture or focal lesion. Sinuses/Orbits: No acute finding. 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 IMPRESSION: 1. Focal areas of low-attenuation in the right posterior parietal lobe and left frontal and parietal lobes. These could represent acute or chronic ischemic changes. MRI suggested for more definitive evaluation if clinically indicated. 2. No acute intracranial hemorrhage. 3. Mild cerebral atrophy and small  vessel ischemic changes. 4. 15 mm right parietal calcified meningioma. Electronically Signed   By: Lucienne Capers M.D.   On: 05/15/2017 00:21   MRI brain, official report impression: 1. No acute intracranial abnormality. 2. 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 3. Patchy white matter changes involving the supratentorial cerebral white matter, nonspecific, but most commonly related to chronic small vessel ischemic disease. 4. 13 mm right temporal occipital meningioma without associated mass effect.  Assessment: 62 year old female presenting with 1 day history of confusion, headache, vision loss and veering to the left in the setting of severe HTN.  1. Symptoms starting to improve with BP management in the ED.  2. Exam findings localizable to right frontoparietal lesion - there is vasogenic edema vs chronic gliosis at this location on MRI but no DWI abnormality.  3. DDx for her presentation includes PRES and subclinical or subtly manifesting partial complex seizures with postictal state. Supporting the latter possibility would be the small meningioma adjacent to the right parietal lobe which is seen on MRI.  4. Of note, the official Radiology report does not consider the possibility of PRES and classifies the patchy signal abnormalities as being secondary to strokes and chronic small vessel ischemic changes. The  report also may have mixed up left and right regarding what is actually a right parieto-frontal lobe region of hyperintensity on the MRI images (reviewed and verified both by myself and Dr. Lorraine Lax). 5. Elevated BUN/Cr suggestive of AKI and/or CKD.  6. Hyperglycemia  Recommendations: 1. EEG (ordered) 2. BP management. Should be discharged on an optimized regimen.  3. Repeat MRI brain in 7 days to assess for possible reversal of the right parietofrontal signal abnormality. If the signal abnormality resolves, then PRES is the most likely diagnosis.  4. Ophthalmology consult.  5. Workup of renal and blood sugar lab abnormalities   Electronically signed: Dr. Kerney Elbe 05/15/2017, 1:32 AM

## 2017-05-15 NOTE — Progress Notes (Signed)
S/O: Patient resting comfortably. Was put on a diet but cannot recall what she ate today.   BP (!) 163/87 (BP Location: Left Arm)   Pulse 93   Temp 97.8 F (36.6 C) (Oral)   Resp 18   SpO2 100%   Ment: Unchanged. Partial left hemineglect. Speech fluent with intact comprehension.  CN: Unchanged. Left visual field cut. Preferentially gazes to right. No facial droop.  Motor: Unchanged. No jerking, twitching or other seizure like activity noted.   EEG 05/15/17:  Impression: This awake and drowsy EEG is abnormal due to the presence of: 1. Mild diffuse slowing of the waking background 2. Additional focal slowing over the right hemisphere 3. Lateralized periodic discharges over the right hemisphere, maximal over the right frontocentral region Clinical Correlation of the above findings indicates diffuse cerebral dysfunction that is non-specific in etiology and can be seen with hypoxic/ischemic injury, toxic/metabolic encephalopathies, neurodegenerative disorders, or medication effect. Focal slowing over the right hemisphere indicates focal cerebral dysfunction in this regions, suggestive of underlying structural or physiologic abnormality. Although lateralized periodic discharges do not represent ongoing seizure activity, they are commonly associated with an acute brain lesion and clinical focal seizures, or post-ictal after focal status epilepticus.    Assessment: 62 year old female presenting with 1 day history of confusion, headache, vision loss and veering to the left in the setting of severe HTN.  1. Symptoms started to improve with BP management in the ED. Her Neurological exam is essentially unchanged from my initial consult assessment.  2. Exam findings localizable to right frontoparietal lesion - there is vasogenic edema vs chronic gliosis at this location on MRI but no DWI abnormality. EEG shows PLEDS over the right hemisphere in addition to focal slowing on the right, without electrographic  seizures. Although lateralized periodic discharges do not represent ongoing seizure activity, they are commonly associated with an acute brain lesion and clinical focal seizures, or post-ictal after focal status epilepticus.  3. DDx for her presentation included PRES, but with EEG results above, subclinical or subtly manifesting partial complex seizures with postictal state is now favored. Further supporting the latter possibility would be the small meningioma adjacent to the right parietal lobe which is seen on MRI.  4. Elevated BUN/Cr suggestive of AKI and/or CKD.  5. Hyperglycemia. Was non-compliant with diabetes meds for about 2 weeks. Severe hyperglycemia can trigger focal status epilepticus.    Recommendations: 1. Loading with Keppra 1000 mg IV x 1, followed by scheduled dosing at 500 mg BID.  2. Repeat EEG in the morning (ordered) 3. BP management. Should be discharged on an optimized regimen.  4. Repeat MRI brain in 7 days to assess for possible reversal of the right parietofrontal signal abnormality. If the signal abnormality does not resolve with BP control then PRES would essentially be ruled out.   5. Ophthalmology consult.  6. IVF  Electronically signed: Dr. Kerney Elbe

## 2017-05-16 ENCOUNTER — Encounter (HOSPITAL_COMMUNITY): Payer: Self-pay | Admitting: General Practice

## 2017-05-16 ENCOUNTER — Inpatient Hospital Stay (HOSPITAL_COMMUNITY): Payer: Self-pay

## 2017-05-16 ENCOUNTER — Other Ambulatory Visit: Payer: Self-pay

## 2017-05-16 DIAGNOSIS — R739 Hyperglycemia, unspecified: Secondary | ICD-10-CM

## 2017-05-16 LAB — COMPREHENSIVE METABOLIC PANEL
ALT: 12 U/L — ABNORMAL LOW (ref 14–54)
ANION GAP: 6 (ref 5–15)
AST: 13 U/L — ABNORMAL LOW (ref 15–41)
Albumin: 2.5 g/dL — ABNORMAL LOW (ref 3.5–5.0)
Alkaline Phosphatase: 42 U/L (ref 38–126)
BUN: 34 mg/dL — ABNORMAL HIGH (ref 6–20)
CO2: 19 mmol/L — AB (ref 22–32)
CREATININE: 1.25 mg/dL — AB (ref 0.44–1.00)
Calcium: 8.3 mg/dL — ABNORMAL LOW (ref 8.9–10.3)
Chloride: 113 mmol/L — ABNORMAL HIGH (ref 101–111)
GFR, EST AFRICAN AMERICAN: 52 mL/min — AB (ref >60)
GFR, EST NON AFRICAN AMERICAN: 45 mL/min — AB (ref >60)
Glucose, Bld: 116 mg/dL — ABNORMAL HIGH (ref 65–99)
POTASSIUM: 3.5 mmol/L (ref 3.5–5.1)
SODIUM: 138 mmol/L (ref 135–145)
Total Bilirubin: 0.6 mg/dL (ref 0.3–1.2)
Total Protein: 5.9 g/dL — ABNORMAL LOW (ref 6.5–8.1)

## 2017-05-16 LAB — GLUCOSE, CAPILLARY
GLUCOSE-CAPILLARY: 150 mg/dL — AB (ref 65–99)
GLUCOSE-CAPILLARY: 164 mg/dL — AB (ref 65–99)
Glucose-Capillary: 135 mg/dL — ABNORMAL HIGH (ref 65–99)
Glucose-Capillary: 108 mg/dL — ABNORMAL HIGH (ref 65–99)
Glucose-Capillary: 205 mg/dL — ABNORMAL HIGH (ref 65–99)

## 2017-05-16 LAB — CBC
HCT: 24.6 % — ABNORMAL LOW (ref 36.0–46.0)
Hemoglobin: 7.9 g/dL — ABNORMAL LOW (ref 12.0–15.0)
MCH: 26.8 pg (ref 26.0–34.0)
MCHC: 32.1 g/dL (ref 30.0–36.0)
MCV: 83.4 fL (ref 78.0–100.0)
PLATELETS: 343 10*3/uL (ref 150–400)
RBC: 2.95 MIL/uL — AB (ref 3.87–5.11)
RDW: 13.1 % (ref 11.5–15.5)
WBC: 5.2 10*3/uL (ref 4.0–10.5)

## 2017-05-16 MED ORDER — INSULIN ASPART 100 UNIT/ML ~~LOC~~ SOLN
3.0000 [IU] | Freq: Three times a day (TID) | SUBCUTANEOUS | Status: DC
  Administered 2017-05-16 – 2017-05-19 (×10): 3 [IU] via SUBCUTANEOUS

## 2017-05-16 MED ORDER — INSULIN GLARGINE 100 UNIT/ML ~~LOC~~ SOLN
10.0000 [IU] | Freq: Every day | SUBCUTANEOUS | Status: DC
  Administered 2017-05-17: 10 [IU] via SUBCUTANEOUS
  Filled 2017-05-16: qty 0.1

## 2017-05-16 MED ORDER — AMLODIPINE BESYLATE 10 MG PO TABS
10.0000 mg | ORAL_TABLET | Freq: Every day | ORAL | Status: DC
  Administered 2017-05-17 – 2017-05-19 (×3): 10 mg via ORAL
  Filled 2017-05-16 (×3): qty 1

## 2017-05-16 MED ORDER — INSULIN GLARGINE 100 UNIT/ML ~~LOC~~ SOLN: 15.0000 [IU] | Freq: Every day | SUBCUTANEOUS | Status: DC

## 2017-05-16 MED ORDER — SODIUM CHLORIDE 0.9 % IV SOLN: INTRAVENOUS | Status: DC

## 2017-05-16 MED ORDER — AMLODIPINE BESYLATE 5 MG PO TABS
5.0000 mg | ORAL_TABLET | Freq: Once | ORAL | Status: AC
  Administered 2017-05-16: 5 mg via ORAL
  Filled 2017-05-16: qty 1

## 2017-05-16 NOTE — Progress Notes (Signed)
Physical Therapy Treatment Patient Details Name: Linda Dennis MRN: [id number] DOB: Dec 24, 1955 Today's Date: 05/16/2017    History of Present Illness Pt is a 62 y/o female admited secondary to hypertensive urgency. Pt with increased confusion. 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 Also revealed R temporal occipital meningioma. PMH includes CVA, L vision loss, and DM.     PT Comments    Pt progresses towards PT goals today, demonstrating standing marching and hip abduction exercises with min guard assist to maintain balance. Pt also requiring min guard assist for transfers to maintain balance.  PT session limited due to pt being connected to bedside EEG machine. Refer to OT note for left-sided inattention observations. Pt requested to use toilet, so BSC brought bedside pt had bowel movement and urinated. Pt able to wipe self with RUE. Educated pt on continuing to move LEs while in bed to keep up strength. Updating recommendation to CIR, as pt with good potential for rehab. According to daughter, pt with new left-sided inattention and not yet back to baseline. PT will follow acutely in order to maximize mobility while in hospital setting.    Follow Up Recommendations  CIR;Supervision for mobility/OOB     Equipment Recommendations  Rolling walker with 5" wheels;3in1 (PT)    Recommendations for Other Services       Precautions / Restrictions Precautions Precautions: Fall Restrictions Weight Bearing Restrictions: No    Mobility  Bed Mobility Overal bed mobility: Needs Assistance Bed Mobility: Supine to Sit;Sit to Supine     Supine to sit: Modified independent (Device/Increase time);HOB elevated Sit to supine: HOB elevated;Modified independent (Device/Increase time)   General bed mobility comments: Pt initiates supine to sit without requiring VCs today.   Transfers Overall transfer level: Needs assistance Equipment used:  None Transfers: Sit to/from Stand Sit to Stand: Min guard         General transfer comment: STSx1 from EOB and x1 from [facility]. Pt with slight sway upon standing, thus min guard for safety.   Ambulation/Gait             General Gait Details: unable to ambulate as pt connected to EEG in room.   Stairs            Wheelchair Mobility    Modified Rankin (Stroke Patients Only) Modified Rankin (Stroke Patients Only) Pre-Morbid Rankin Score: Slight disability Modified Rankin: Moderately severe disability     Balance Overall balance assessment: Needs assistance Sitting-balance support: No upper extremity supported;Feet supported Sitting balance-Leahy Scale: Good Sitting balance - Comments: pt demonstrates toileting without external support. Pt able to lean forward and utilize UE to wipe.    Standing balance support: During functional activity;Single extremity supported Standing balance-Leahy Scale: Fair Standing balance comment: Pt demonstrates static standing at EOB without external support, however requires min guard for dynamic balance activities, as pt slightly unsteady.                            Cognition Arousal/Alertness: Awake/alert Behavior During Therapy: WFL for tasks assessed/performed Overall Cognitive Status: No family/caregiver present to determine baseline cognitive functioning                                 General Comments: Pt is talkative and pleasant.      Exercises General Exercises - Lower Extremity Hip ABduction/ADduction: AROM;5 reps;Standing;Right;Left(min  guard for balance required) Hip Flexion/Marching: AROM;20 reps;Both;Standing("baby marches" - does not lift knees high, min guard for balance required)    General Comments General comments (skin integrity, edema, etc.): Pt reports that she will be living with daughter upon discharge. Daughter works during day and is home with pt in evenings. Consult OT regarding  left-sided neglect. Unable to elicit left-sided neglect during today's PT session as only EOB standing exercises performed.       Pertinent Vitals/Pain Pain Assessment: No/denies pain    Home Living Family/patient expects to be discharged to:: Private residence Living Arrangements: Children Available Help at Discharge: Family Type of Home: Apartment Home Access: Stairs to enter Entrance Stairs-Rails: Left;Right Home Layout: One level Home Equipment: None Additional Comments: Pt lives in [place], however, is staying with her children while she is here. Unsure of specific details and family assist able to be provided upon d/c as no family present.     Prior Function Level of Independence: Needs assistance  Gait / Transfers Assistance Needed: Pt reports she needed some help getting around secondary to L visual deficits, however, did not use AD.  - per PT note; Pt states she was independent ADL's / Homemaking Assistance Needed: independent     PT Goals (current goals can now be found in the care plan section) Acute Rehab PT Goals Patient Stated Goal: to eventually return to [place] Progress towards PT goals: Progressing toward goals    Frequency    Min 3X/week      PT Plan Discharge plan needs to be updated    Co-evaluation              AM-PAC PT "6 Clicks" Daily Activity  Outcome Measure  Difficulty turning over in bed (including adjusting bedclothes, sheets and blankets)?: None Difficulty moving from lying on back to sitting on the side of the bed? : A Little Difficulty sitting down on and standing up from a chair with arms (e.g., wheelchair, bedside commode, etc,.)?: A Little Help needed moving to and from a bed to chair (including a wheelchair)?: A Little Help needed walking in hospital room?: A Little Help needed climbing 3-5 steps with a railing? : A Lot 6 Click Score: 18    End of Session   Activity Tolerance: Patient tolerated treatment well Patient left:  in bed;with call bell/phone within reach;with bed alarm set   PT Visit Diagnosis: Unsteadiness on feet (R26.81);Other abnormalities of gait and mobility (R26.89);Muscle weakness (generalized) (M62.81)     Time: 1829-9371 PT Time Calculation (min) (ACUTE ONLY): 22 min  Charges:  $Therapeutic Exercise: 8-22 mins                    G Codes:       Judee Clara, SPT   Judee Clara 05/16/2017, 3:59 PM

## 2017-05-16 NOTE — Progress Notes (Signed)
PROGRESS NOTE    Linda Dennis  VVO:[id number] DOB: August 20, 1955 DOA: 05/14/2017 PCP:    Outpatient Specialists:     Brief Narrative:  Linda Dennis is a 62 y.o. female with history of hypertension diabetes mellitus who originally resides in [place] is visiting her family and has been in the [place] since March 16, 2017.  Patient has not brought her medications from home.  And has not taken any of her medications for last 2 months.  Since yesterday morning patient states she has been having a frontal headache throbbing in nature with some nausea vomiting.  At times she felt weak in the extremities but has not lost function of the extremities.  patient on exam has poor vision on the left eye which patient states is chronic and also has a cataract in the left eye.     Assessment & Plan:   Active Problems:   Hypertensive urgency   Uncontrolled type 2 diabetes mellitus with hyperglycemia (HCC)   Normocytic normochromic anemia   Hypertensive urgency  -amlodipine 10 mg and as needed IV hydralazine for now -Closely follow blood pressure trends -family unsure of what she was taking in [place] -patient told me they would start a medication, she would go back in a few weeks and they would give her another medication  Diabetes mellitus type 2 uncontrolled -lantus 10 units -SSI -HgbA1c: 13  Reported confusion -appears resolved -suspect may be an educational/language barrier  Headache probably from uncontrolled blood pressure  -improved -sed rate elevated, not sure what to make of this  ?seizures -initial EEG showed: This awake and drowsy EEG is abnormal due to the presence of: 1. Mild diffuse slowing of the waking background 2. Additional focal slowing over the right hemisphere 3. Lateralized periodic discharges over the right hemisphere, maximal over the right frontocentral region -IV keppra -repeat EEG pending -appreciate neurology following -MRI:  1. Small remote left frontal and  occipital lobe infarcts, with additional probable small remote left parietal infarct. 2. Patchy white matter changes involving the supratentorial cerebral white matter, nonspecific, but most commonly related to chronic small vessel ischemic disease. 3. 13 mm right temporal occipital meningioma without associated mass effect.  Normocytic normochromic anemia -no old labs to compare -follow CBC -transfuse as needed -occult blood stool  Elevated creatinine probably chronic kidney disease -improved some with hydration     DVT prophylaxis:  SCD's  Code Status: Full Code   Family Communication: Spoke with daughter in [place] 1/2-- she is unaware of her mother's medications-- she was going to talk to her sister in [place]  Disposition Plan:     Consultants:  neuro  Subjective: Needs to use the bathroom  Objective: Vitals:   05/15/17 2230 05/16/17 0207 05/16/17 0624 05/16/17 0951  BP: 140/76 (!) 141/72 137/72 (!) 162/91  Pulse: 89 91 89 90  Resp: 18 18 18 20   Temp:   97.7 F (36.5 C) 98 F (36.7 C)  TempSrc:   Oral Oral  SpO2: 100% 99% 100% 98%    Intake/Output Summary (Last 24 hours) at 05/16/2017 1249 Last data filed at 05/15/2017 2200 Gross per 24 hour  Intake 1013.75 ml  Output -  Net 1013.75 ml   There were no vitals filed for this visit.  Examination:  General exam: Appears calm and comfortable - heavy [place] accent Respiratory system: Clear to auscultation. Respiratory effort normal. Cardiovascular system: S1 & S2 heard, RRR. No JVD, murmurs, rubs, gallops or clicks. No pedal edema. Gastrointestinal system: Abdomen is  nondistended, soft and nontender. No organomegaly or masses felt. Normal bowel sounds heard. Central nervous system: Alert and oriented-- able to give me information about her medical issue Extremities: Symmetric 5 x 5 power. Psychiatry: Judgement and insight appear normal    Data Reviewed: I have personally reviewed following labs and  imaging studies  CBC: Recent Labs  Lab 05/14/17 2015 05/16/17 0533  WBC 9.2 5.2  HGB 9.2* 7.9*  HCT 27.4* 24.6*  MCV 82.5 83.4  PLT 386 841   Basic Metabolic Panel: Recent Labs  Lab 05/14/17 2015 05/15/17 2113 05/16/17 0533  NA 130* 136 138  K 4.4 4.1 3.5  CL 97* 109 113*  CO2 23 20* 19*  GLUCOSE 385* 188* 116*  BUN 52* 40* 34*  CREATININE 1.63* 1.33* 1.25*  CALCIUM 9.0 8.7* 8.3*   GFR: CrCl cannot be calculated (Unknown ideal weight.). Liver Function Tests: Recent Labs  Lab 05/14/17 2015 05/16/17 0533  AST 16 13*  ALT 15 12*  ALKPHOS 59 42  BILITOT 0.9 0.6  PROT 8.0 5.9*  ALBUMIN 3.4* 2.5*   No results for input(s): LIPASE, AMYLASE in the last 168 hours. No results for input(s): AMMONIA in the last 168 hours. Coagulation Profile: No results for input(s): INR, PROTIME in the last 168 hours. Cardiac Enzymes: No results for input(s): CKTOTAL, CKMB, CKMBINDEX, TROPONINI in the last 168 hours. BNP (last 3 results) No results for input(s): PROBNP in the last 8760 hours. HbA1C: Recent Labs    05/15/17 0208  HGBA1C 13.3*   CBG: Recent Labs  Lab 05/15/17 1719 05/16/17 0053 05/16/17 0413 05/16/17 0626 05/16/17 1110  GLUCAP 279* 150* 135* 108* 164*   Lipid Profile: No results for input(s): CHOL, HDL, LDLCALC, TRIG, CHOLHDL, LDLDIRECT in the last 72 hours. Thyroid Function Tests: No results for input(s): TSH, T4TOTAL, FREET4, T3FREE, THYROIDAB in the last 72 hours. Anemia Panel: Recent Labs    05/15/17 0208  VITAMINB12 996*  FOLATE 21.0  FERRITIN 164  TIBC 244*  IRON 37  RETICCTPCT 1.4   Urine analysis:    Component Value Date/Time   COLORURINE STRAW (A) 05/15/2017 0835   APPEARANCEUR CLEAR 05/15/2017 0835   LABSPEC 1.014 05/15/2017 0835   PHURINE 5.0 05/15/2017 0835   GLUCOSEU >=500 (A) 05/15/2017 0835   HGBUR MODERATE (A) 05/15/2017 0835   BILIRUBINUR NEGATIVE 05/15/2017 0835   KETONESUR 5 (A) 05/15/2017 0835   PROTEINUR 100 (A)  05/15/2017 0835   NITRITE NEGATIVE 05/15/2017 0835   LEUKOCYTESUR NEGATIVE 05/15/2017 0835     )No results found for this or any previous visit (from the past 240 hour(s)).    Anti-infectives (From admission, onward)   None       Radiology Studies: Ct Head Wo Contrast  Result Date: 05/15/2017 CLINICAL DATA:  Headache beginning this morning. Confusion. History of stroke and diabetes. EXAM: CT HEAD WITHOUT CONTRAST TECHNIQUE: Contiguous axial images were obtained from the base of the skull through the vertex without intravenous contrast. COMPARISON:  None. FINDINGS: Brain: Mild cerebral atrophy. No ventricular dilatation. Low-attenuation changes in the deep white matter are nonspecific but likely due to small vessel ischemia. Focal areas of low-attenuation demonstrated in a right posterior parietal lobe and left frontal and parietal lobes. These could represent old infarcts but margins are not well defined and acute infarct and 1 or more locations could also be present. MRI would be more sensitive for evaluation of acute ischemia. No mass effect or midline shift. No abnormal extra-axial fluid collections. Gray-white matter  junctions are distinct. Basal cisterns are not effaced. No acute intracranial hemorrhage. There is a nodular extra-axial calcification along the right posterior parietal dural surface measuring 1.5 cm diameter, likely representing a calcified meningioma. Vascular: Internal carotid artery vascular calcifications are present. Skull: Normal. Negative for fracture or focal lesion. Sinuses/Orbits: No acute finding. 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 IMPRESSION: 1. Focal areas of low-attenuation in the right posterior parietal lobe and left frontal and parietal lobes. These could represent acute or chronic ischemic changes. MRI suggested for more definitive evaluation if clinically indicated. 2. No acute intracranial hemorrhage. 3. Mild cerebral atrophy  and small vessel ischemic changes. 4. 15 mm right parietal calcified meningioma. Electronically Signed   By: Lucienne Capers M.D.   On: 05/15/2017 00:21   Mr Brain Wo Contrast  Result Date: 05/15/2017 CLINICAL DATA:  Initial evaluation for acute headache with confusion. EXAM: MRI HEAD WITHOUT CONTRAST TECHNIQUE: Multiplanar, multiecho pulse sequences of the brain and surrounding structures were obtained without intravenous contrast. COMPARISON:  Prior CT from earlier same day. FINDINGS: Brain: Age related cerebral atrophy. Scattered patchy T2/FLAIR hyperintensities seen involving the periventricular, deep, and subcortical white matter both cerebral hemispheres, nonspecific, but likely related to chronic microvascular changes. Small remote left frontal and right occipital lobe infarct CED. Probable additional small remote left parietal lobe infarct. No abnormal foci of restricted diffusion to suggest acute or subacute ischemia. Punctate focus of diffusion abnormality within the left periatrial white matter noted, demonstrates no associated T2 correlate and is felt to be most consistent with T2 shine through. Gray-white matter differentiation maintained. No evidence for acute intracranial hemorrhage. Well-circumscribed 13 mm extra-axial lesion at the right temporal occipital region consistent with a meningioma. This appears densely calcified on prior CT. No associated edema or mass effect. No other mass lesion. No mass effect or midline shift. No hydrocephalus. No extra-axial fluid collection. Pituitary gland suprasellar region normal. Midline structures intact. Vascular: Major intracranial vascular flow voids are maintained. Skull and upper cervical spine: Craniocervical junction normal. Degenerative disc bulging noted at C3-4 with resultant mild to moderate stenosis. Bone marrow signal intensity within normal limits. No scalp soft tissue abnormality. Sinuses/Orbits: Globes and orbital soft tissues within normal  limits. Paranasal sinuses grossly clear, limited evaluation by susceptibility artifact. Small left mastoid effusion. Inner ear structures normal. Other: None. IMPRESSION: 1. No acute intracranial abnormality. 2. 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 3. Patchy white matter changes involving the supratentorial cerebral white matter, nonspecific, but most commonly related to chronic small vessel ischemic disease. 4. 13 mm right temporal occipital meningioma without associated mass effect. Electronically Signed   By: Jeannine Boga M.D.   On: 05/15/2017 05:07        Scheduled Meds: . [START ON 05/17/2017] amLODipine  10 mg Oral Daily  . insulin aspart  0-9 Units Subcutaneous TID WC  . insulin aspart  3 Units Subcutaneous TID WC  . [START ON 05/17/2017] insulin glargine  10 Units Subcutaneous Daily   Continuous Infusions: . sodium chloride 50 mL/hr at 05/16/17 1244  . levETIRAcetam Stopped (05/16/17 0836)     LOS: 1 day    Time spent: 35 min    Geradine Girt, DO [facility] Pager [phone]  If 7PM-7AM, please contact night-coverage [email or web address] Password [facility] 05/16/2017, 12:49 PM

## 2017-05-16 NOTE — Progress Notes (Signed)
Reason for consult: Visual loss/altered mental status  Subjective: No significant change in exam from yesterday night. However much improved since presentation.  ROS: negative except above*  Examination  Vital signs in last 24 hours: Temp:  [97.7 F (36.5 C)-98 F (36.7 C)] 97.7 F (36.5 C) (01/03 1650) Pulse Rate:  [88-93] 91 (01/03 1650) Resp:  [16-20] 16 (01/03 1650) BP: (137-163)/(72-91) 149/88 (01/03 1650) SpO2:  [98 %-100 %] 98 % (01/03 1650)  General: Not in distress, cooperative CVS: pulse-normal rate and rhythm RS: breathing comfortably Extremities: normal   Neuro: Mental status: Patient alert and oriented to location, has conversation appropriately Cranial nerves: Left HH. Neglect. No forced gaze deviation/nystagmus Motor: Moves both sides equally   Basic Metabolic Panel: Recent Labs  Lab 05/14/17 2015 05/15/17 2113 05/16/17 0533  NA 130* 136 138  K 4.4 4.1 3.5  CL 97* 109 113*  CO2 23 20* 19*  GLUCOSE 385* 188* 116*  BUN 52* 40* 34*  CREATININE 1.63* 1.33* 1.25*  CALCIUM 9.0 8.7* 8.3*    CBC: Recent Labs  Lab 05/14/17 2015 05/16/17 0533  WBC 9.2 5.2  HGB 9.2* 7.9*  HCT 27.4* 24.6*  MCV 82.5 83.4  PLT 386 343     Coagulation Studies: No results for input(s): LABPROT, INR in the last 72 hours.  Imaging Reviewed:     ASSESSMENT AND PLAN  62 year old female presenting with 1 day history of confusion, headache, vision lossand veering to the left in the setting of severe HTN and hyperglycemia.MRI shows R temporo occipital  meningioma  plus right occipital gliosis vs vasogenic edema. EEG shows PLEDS over the right hemisphere in addition to focal slowing on the right, without electrographic seizures Mental status improving with control of BP/ however still has visual neglect   Differential Diagnosis  Posterior reversible encephalopathy syndrome vs Seizures either originating from occipital lobe gliosis/vs edema or right temporal meningioma.      Recommendations: 1.Continue Keppra  500 mg BID.  2. LTM EEG connected ; read pending 3. BP management. Should be discharged on an optimized regimen.  4. Repeat MRI brain in 7 days to assess for possible reversal of the right parietofrontal signal abnormality. If the signal abnormality does not resolve with BP control then PRES would essentially be ruled out.   5. Ophthalmology consult.     Karena Addison Mckenzee Beem [facility] Pager Number [phone] For questions after 7pm please refer to AMION to reach the Neurologist on call

## 2017-05-16 NOTE — Evaluation (Signed)
Speech Language Pathology Evaluation Patient Details Name: Linda Dennis MRN: [id number] DOB: 20-Jul-1955 Today's Date: 05/16/2017 Time: 6712-4580 SLP Time Calculation (min) (ACUTE ONLY): 24 min  Problem List:  Patient Active Problem List   Diagnosis Date Noted  . Hypertensive urgency 05/15/2017  . Uncontrolled type 2 diabetes mellitus with hyperglycemia ([place]) 05/15/2017  . Normocytic normochromic anemia 05/15/2017  . Altered mental status    Past Medical History:  Past Medical History:  Diagnosis Date  . Diabetes mellitus without complication ([place])   . Hypertensive urgency 05/15/2017  . Stroke [facility])    Past Surgical History:  Past Surgical History:  Procedure Laterality Date  . NO PAST SURGERIES     HPI:  62 y.o. female with history of hypertension diabetes mellitus who originally resides in [place] is visiting her family and has been in the [place] since March 16, 2017.  Patient has not brought her medications from home.  And has not taken any of her medications for last 2 months.  Since yesterday morning patient states she has been having a frontal headache throbbing in nature with some nausea vomiting.  At times she felt weak in the extremities but has not lost function of the extremities.  Patient also states that she has poor vision in the left eye.  As per the daughter who discussed with the ER physician patient also looked confused. 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, with additional probable small remote left parietal infarct  Assessment / Plan / Recommendation Clinical Impression   Pt administered portions of the MOCA [facility] Cognitive Assessment) with an overall score (with visuospatial component eliminated) of 16/25 with language barrier, limited (if any) formal education and left neglect impairing score further; pt oriented x4, memory recall of 4/5 words with delayed recall and repetition of sentences/numbers, calculation  portion correct, but with increased processing needed; attention deficits noted with simple problem solving; no family available during SLE to provide input of baseline cognitive functioning; pt exhibited decreased intellectual awareness in addition to above deficits; visuospatial deficits noted with left neglect, so visual portion eliminated from [facility]; on-going cognitive assessment while pt in acute setting paired with cognitive tx with ST for above mentioned deficits.    SLP Assessment  SLP Recommendation/Assessment: Patient needs continued Speech Language Pathology Services SLP Visit Diagnosis: Cognitive communication deficit (R41.841)    Follow Up Recommendations  Other (comment)(TBD)    Frequency and Duration min 2x/week  1 week      SLP Evaluation Cognition  Overall Cognitive Status: No family/caregiver present to determine baseline cognitive functioning Arousal/Alertness: Awake/alert Orientation Level: Oriented X4 Attention: Sustained Sustained Attention: Impaired Sustained Attention Impairment: Verbal basic;Functional basic Awareness: Impaired Awareness Impairment: Intellectual impairment Problem Solving: Impaired Problem Solving Impairment: Verbal basic;Functional basic Safety/Judgment: Impaired Comments: (Reminders needed d/t visual deficits)       Comprehension  Auditory Comprehension Overall Auditory Comprehension: Appears within functional limits for tasks assessed Yes/No Questions: Within Functional Limits Commands: Impaired Multistep Basic Commands: 25-49% accurate Conversation: Simple Interfering Components: Attention;Visual impairments EffectiveTechniques: Extra processing time;Repetition;Visual/Gestural cues Visual Recognition/Discrimination Discrimination: Not tested Reading Comprehension Reading Status: Unable to assess (comment)(Pt with limited if any reading ability)    Expression Expression Primary Mode of Expression: Verbal Verbal  Expression Overall Verbal Expression: Other (comment)(DTA d/t visual impairments) Level of Generative/Spontaneous Verbalization: Conversation Repetition: Impaired Level of Impairment: Sentence level Naming: Impairment Responsive: 51-75% accurate Confrontation: Impaired Convergent: 25-49% accurate Divergent: 25-49% accurate Other Naming Comments: language barrier may impact  Interfering Components: Attention Non-Verbal Means of Communication: Not applicable Written Expression Dominant Hand: Right Written Expression: Unable to assess (comment)(d/t visual impairments)   Oral / Motor  Motor Speech Overall Motor Speech: Appears within functional limits for tasks assessed Respiration: Within functional limits Phonation: Normal Resonance: Within functional limits Articulation: Within functional limitis Intelligibility: Intelligible Motor Planning: Witnin functional limits Motor Speech Errors: Not applicable                       Elvina Sidle, M.S., CCC-SLP 05/16/2017, 3:37 PM

## 2017-05-16 NOTE — Progress Notes (Signed)
vLTM EEG running. Tested event button. Notified neuro °

## 2017-05-16 NOTE — Progress Notes (Signed)
Occupational Therapy Evaluation Patient Details Name: Linda Dennis MRN: [id number] DOB: 02-23-1956 Today's Date: 05/16/2017    History of Present Illness Pt is a 62 y/o female admited secondary to hypertensive urgency. Pt with increased confusion. 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 Also revealed R temporal occipital meningioma. PMH includes CVA, L vision loss, and DM.    Clinical Impression   PTA, pt visiting with her daughter, however, pt usually lives in [place] with her other daughter. Spoke with pt's daughter over the phone and she clarified that the pt only had a small "problem with her vision in her left eye" and that her mother was independent with mobility and ADL PTA. Pt with a significant decline in functional level, requiring mod A with ADL and min A with mobility. Pt with apparent L neglect, L visual field cut and LUE motor planning deficits,  impacting her ability to safely care for herself. Feel pt would benefit from intensive rehab at CIR to maximize pt's functional level of performance to DC home with her daughter. Daughter states 24/7 S can be arranged. Will follow acutely to address established goals and facilitate safe DC to next venue of care.     Follow Up Recommendations  Supervision/Assistance - 24 hour;CIR    Equipment Recommendations  3 in 1 bedside commode    Recommendations for Other Services Rehab consult     Precautions / Restrictions Precautions Precautions: Fall Restrictions Weight Bearing Restrictions: No      Mobility Bed Mobility Overal bed mobility: Needs Assistance Bed Mobility: Supine to Sit;Sit to Supine     Supine to sit: Supervision Sit to supine: Supervision   General bed mobility comments: Pt required multiple vc to get OOB on R  Transfers Overall transfer level: Needs assistance Equipment used: None Transfers: Sit to/from Stand Sit to Stand: Min assist              Balance  Overall balance assessment: Needs assistance Sitting-balance support: No upper extremity supported;Feet supported Sitting balance-Leahy Scale: Good     Standing balance support: During functional activity;Single extremity supported Standing balance-Leahy Scale: Fair                             ADL either performed or assessed with clinical judgement   ADL Overall ADL's : Needs assistance/impaired Eating/Feeding: Minimal assistance Eating/Feeding Details (indicate cue type and reason): pt not aware of items on L side of tray. Required cues to scan tray to find utensils Grooming: Minimal assistance;Sitting   Upper Body Bathing: Minimal assistance;Sitting   Lower Body Bathing: Minimal assistance;Sit to/from stand   Upper Body Dressing : Moderate assistance;Sitting   Lower Body Dressing: Moderate assistance;Sit to/from stand   Toilet Transfer: Minimal assistance;BSC;Stand-pivot   Toileting- Water quality scientist and Hygiene: Moderate assistance;Sit to/from stand Toileting - Clothing Manipulation Details (indicate cue type and reason): physcial cues to locate toilet papaer which was L of midline     Functional mobility during ADLs: Minimal assistance(limited by EEG) General ADL Comments: Pt with apparent L neglect during ADL tasks; Pt unable to locate her dinner tray on the left sdie of her bed     Vision   Vision Assessment?: Vision impaired- to be further tested in functional context Additional Comments: per chart, pt with L eye vision probgels PTA, however, pt staes her vision starting "going bad after she came to the [place]". Does not see targets  in L field' poor depth perception  R gaze preference     Perception Perception Perception Tested?: Yes Perception Deficits: Inattention/neglect;Spatial orientation Inattention/Neglect: Does not attend to left side of body;Does not attend to left visual field Spatial deficits: under/over reaching; does not compensate to  locate itmes   Praxis Praxis Praxis-Other Comments: ? LUE limb apraxia    Pertinent Vitals/Pain Pain Assessment: No/denies pain     Hand Dominance Right   Extremity/Trunk Assessment Upper Extremity Assessment Upper Extremity Assessment: LUE deficits/detail LUE Deficits / Details: difficultywith coordinated movements LUE Coordination: decreased fine motor;decreased gross motor   Lower Extremity Assessment Lower Extremity Assessment: Defer to PT evaluation   Cervical / Trunk Assessment Cervical / Trunk Assessment: Kyphotic   Communication Communication Communication: No difficulties;Prefers language other than English   Cognition Arousal/Alertness: Awake/alert Behavior During Therapy: WFL for tasks assessed/performed Overall Cognitive Status: No family/caregiver present to determine baseline cognitive functioning                                     General Comments       Exercises     Shoulder Instructions      Home Living Family/patient expects to be discharged to:: Private residence Living Arrangements: Children Available Help at Discharge: Family Type of Home: Apartment Home Access: Stairs to enter Technical brewer of Steps: flight  Entrance Stairs-Rails: Left;Right Home Layout: One level     Bathroom Shower/Tub: Teacher, early years/pre: Standard     Home Equipment: None   Additional Comments: Pt lives in [place], however, is staying with her children while she is here. Unsure of specific details and family assist able to be provided upon d/c as no family present.       Prior Functioning/Environment Level of Independence: Needs assistance  Gait / Transfers Assistance Needed: Pt reports she needed some help getting around secondary to L visual deficits, however, did not use AD.  - per PT note; Pt states she was independent ADL's / Homemaking Assistance Needed: independent            OT Problem List: Decreased  strength;Decreased range of motion;Decreased activity tolerance;Impaired balance (sitting and/or standing);Impaired vision/perception;Decreased coordination;Decreased cognition;Decreased safety awareness;Decreased knowledge of use of DME or AE;Impaired UE functional use      OT Treatment/Interventions: Self-care/ADL training;DME and/or AE instruction;Therapeutic activities;Cognitive remediation/compensation;Visual/perceptual remediation/compensation;Patient/family education;Balance training    OT Goals(Current goals can be found in the care plan section) Acute Rehab OT Goals Patient Stated Goal: to eventually return to [place] OT Goal Formulation: With patient Time For Goal Achievement: 05/30/17 Potential to Achieve Goals: Good ADL Goals Pt Will Perform Grooming: with set-up;sitting Pt Will Perform Upper Body Bathing: with set-up;sitting Pt Will Perform Upper Body Dressing: with set-up;sitting Pt Will Perform Lower Body Dressing: with set-up;sit to/from stand Pt Will Transfer to Toilet: with supervision;ambulating  OT Frequency: Min 3X/week   Barriers to D/C: Other (comment)(unsure of caregiver support)          Co-evaluation              AM-PAC PT "6 Clicks" Daily Activity     Outcome Measure Help from another person eating meals?: A Little Help from another person taking care of personal grooming?: A Little Help from another person toileting, which includes using toliet, bedpan, or urinal?: A Little Help from another person bathing (including washing, rinsing, drying)?: A Little Help from another person  to put on and taking off regular upper body clothing?: A Lot Help from another person to put on and taking off regular lower body clothing?: A Lot 6 Click Score: 16   End of Session Nurse Communication: Mobility status(Place Call beel on R side)  Activity Tolerance: Patient tolerated treatment well Patient left: with call bell/phone within reach;in bed;with bed alarm  set;with SCD's reapplied(on EEG)                   Time: 0998-3382 OT Time Calculation (min): 35 min Charges:  OT General Charges $OT Visit: 1 Visit OT Evaluation $OT Eval Moderate Complexity: 1 Mod OT Treatments $Self Care/Home Management : 8-22 mins G-Codes:     [facility], OT/L  [phone] 05/16/2017  Deniyah Dillavou,HILLARY 05/16/2017, 1:43 PM

## 2017-05-16 NOTE — Progress Notes (Signed)
Rehab Admissions Coordinator Note:  Patient was screened by Retta Diones for appropriateness for an Inpatient Acute Rehab Consult.  At this time, we are recommending Inpatient Rehab consult.  Retta Diones 05/16/2017, 3:56 PM  I can be reached at [phone].

## 2017-05-17 DIAGNOSIS — I160 Hypertensive urgency: Principal | ICD-10-CM

## 2017-05-17 DIAGNOSIS — I10 Essential (primary) hypertension: Secondary | ICD-10-CM

## 2017-05-17 DIAGNOSIS — D329 Benign neoplasm of meninges, unspecified: Secondary | ICD-10-CM

## 2017-05-17 DIAGNOSIS — R51 Headache: Secondary | ICD-10-CM

## 2017-05-17 LAB — GLUCOSE, CAPILLARY
GLUCOSE-CAPILLARY: 198 mg/dL — AB (ref 65–99)
GLUCOSE-CAPILLARY: 232 mg/dL — AB (ref 65–99)
Glucose-Capillary: 185 mg/dL — ABNORMAL HIGH (ref 65–99)
Glucose-Capillary: 104 mg/dL — ABNORMAL HIGH (ref 65–99)
Glucose-Capillary: 174 mg/dL — ABNORMAL HIGH (ref 65–99)

## 2017-05-17 LAB — CBC
HEMATOCRIT: 24.5 % — AB (ref 36.0–46.0)
Hemoglobin: 7.7 g/dL — ABNORMAL LOW (ref 12.0–15.0)
MCH: 26.1 pg (ref 26.0–34.0)
MCHC: 31.4 g/dL (ref 30.0–36.0)
MCV: 83.1 fL (ref 78.0–100.0)
Platelets: 335 10*3/uL (ref 150–400)
RBC: 2.95 MIL/uL — AB (ref 3.87–5.11)
RDW: 12.9 % (ref 11.5–15.5)
WBC: 5.5 10*3/uL (ref 4.0–10.5)

## 2017-05-17 LAB — BASIC METABOLIC PANEL
ANION GAP: 6 (ref 5–15)
BUN: 34 mg/dL — ABNORMAL HIGH (ref 6–20)
CO2: 21 mmol/L — AB (ref 22–32)
Calcium: 8.5 mg/dL — ABNORMAL LOW (ref 8.9–10.3)
Chloride: 111 mmol/L (ref 101–111)
Creatinine, Ser: 1.42 mg/dL — ABNORMAL HIGH (ref 0.44–1.00)
GFR calc Af Amer: 45 mL/min — ABNORMAL LOW (ref >60)
GFR calc non Af Amer: 39 mL/min — ABNORMAL LOW (ref >60)
GLUCOSE: 210 mg/dL — AB (ref 65–99)
POTASSIUM: 4 mmol/L (ref 3.5–5.1)
Sodium: 138 mmol/L (ref 135–145)

## 2017-05-17 MED ORDER — INSULIN GLARGINE 100 UNIT/ML ~~LOC~~ SOLN
10.0000 [IU] | Freq: Once | SUBCUTANEOUS | Status: AC
  Administered 2017-05-17: 10 [IU] via SUBCUTANEOUS
  Filled 2017-05-17: qty 0.1

## 2017-05-17 MED ORDER — SODIUM CHLORIDE 0.9 % IV SOLN
510.0000 mg | Freq: Once | INTRAVENOUS | Status: AC
  Administered 2017-05-17: 510 mg via INTRAVENOUS
  Filled 2017-05-17: qty 17

## 2017-05-17 MED ORDER — INSULIN STARTER KIT- PEN NEEDLES (ENGLISH)
1.0000 | Freq: Once | Status: AC
  Administered 2017-05-17: 1
  Filled 2017-05-17: qty 1

## 2017-05-17 MED ORDER — INSULIN GLARGINE 100 UNIT/ML ~~LOC~~ SOLN
20.0000 [IU] | Freq: Every day | SUBCUTANEOUS | Status: DC
  Administered 2017-05-18 – 2017-05-19 (×2): 20 [IU] via SUBCUTANEOUS
  Filled 2017-05-17 (×2): qty 0.2

## 2017-05-17 MED ORDER — LEVETIRACETAM 500 MG PO TABS
500.0000 mg | ORAL_TABLET | Freq: Two times a day (BID) | ORAL | Status: DC
  Administered 2017-05-17 – 2017-05-19 (×4): 500 mg via ORAL
  Filled 2017-05-17 (×4): qty 1

## 2017-05-17 NOTE — Progress Notes (Signed)
Physical Therapy Treatment Patient Details Name: Linda Dennis MRN: [id number] DOB: 03/16/1956 Today's Date: 05/17/2017    History of Present Illness Pt is a 62 y/o female admited secondary to hypertensive urgency. Pt with increased confusion. 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 Also revealed R temporal occipital meningioma. PMH includes CVA, L vision loss, and DM.     PT Comments    Pt remains limited to bed side treatment due to EEG.  Pt performed standing and supine exercises during session.  Pt required min assist for standing activities and her balance remains poor.  Pt continues to benefit from skilled rehab in post acute setting to improve strength, coordination and balance.  Plan next session for continued standing dynamic balance activities to improve coordination and function.      Follow Up Recommendations  CIR;Supervision for mobility/OOB     Equipment Recommendations  Rolling walker with 5" wheels;3in1 (PT)    Recommendations for Other Services       Precautions / Restrictions Precautions Precautions: Fall Restrictions Weight Bearing Restrictions: No    Mobility  Bed Mobility Overal bed mobility: Modified Independent Bed Mobility: Supine to Sit;Sit to Supine     Supine to sit: Modified independent (Device/Increase time);HOB elevated Sit to supine: HOB elevated;Modified independent (Device/Increase time)   General bed mobility comments: Pt initiates supine to sit without requiring VCs today.   Transfers Overall transfer level: Needs assistance Equipment used: None Transfers: Sit to/from Stand Sit to Stand: Min guard Stand pivot transfers: Min assist       General transfer comment: cues for hand placement. min assist to steady balance in standing.    Ambulation/Gait Ambulation/Gait assistance: (Pt remains unable to progress gait due to EEG lead on her head.  )               Stairs             Wheelchair Mobility    Modified Rankin (Stroke Patients Only)       Balance Overall balance assessment: Needs assistance   Sitting balance-Leahy Scale: Good Sitting balance - Comments: pt demonstrates toileting without external support. Pt able to lean forward and utilize UE to wipe.      Standing balance-Leahy Scale: Poor Standing balance comment: Pt performed dynamic exercises in standing and required min assist to steady without UE support.                              Cognition Arousal/Alertness: Awake/alert Behavior During Therapy: WFL for tasks assessed/performed Overall Cognitive Status: Difficult to assess                                 General Comments: Pt is talkative and pleasant.      Exercises General Exercises - Lower Extremity Quad Sets: AROM;Both;10 reps;Supine Heel Slides: AROM;Both;10 reps;Supine Hip ABduction/ADduction: AROM;Both;10 reps;Standing(min assistance for balance. ) Straight Leg Raises: AROM;Both;10 reps;Supine(extensor lag observed in B LEs.  ) Hip Flexion/Marching: AROM;Both;10 reps;Standing(min assistance for balance in standing.  ) Heel Raises: AROM;Both;10 reps;Standing(Pt required assistance for balance.  ) Mini-Sqauts: AROM;Both;10 reps;Standing(min assist for standing balance and tactile cues for upper trunk control.  )    General Comments        Pertinent Vitals/Pain Pain Assessment: Faces Faces Pain Scale: No hurt    Home Living  Prior Function            PT Goals (current goals can now be found in the care plan section) Acute Rehab PT Goals Patient Stated Goal: to eventually return to [place] Potential to Achieve Goals: Good Progress towards PT goals: Progressing toward goals    Frequency    Min 3X/week      PT Plan Discharge plan needs to be updated    Co-evaluation              AM-PAC PT "6 Clicks" Daily Activity  Outcome Measure   Difficulty turning over in bed (including adjusting bedclothes, sheets and blankets)?: None Difficulty moving from lying on back to sitting on the side of the bed? : A Little Difficulty sitting down on and standing up from a chair with arms (e.g., wheelchair, bedside commode, etc,.)?: Unable Help needed moving to and from a bed to chair (including a wheelchair)?: A Little   Help needed climbing 3-5 steps with a railing? : A Lot 6 Click Score: 13    End of Session Equipment Utilized During Treatment: Gait belt Activity Tolerance: Patient tolerated treatment well Patient left: in bed;with call bell/phone within reach;with bed alarm set Nurse Communication: Mobility status PT Visit Diagnosis: Unsteadiness on feet (R26.81);Other abnormalities of gait and mobility (R26.89);Muscle weakness (generalized) (M62.81)     Time: 1834-3735 PT Time Calculation (min) (ACUTE ONLY): 16 min  Charges:  $Therapeutic Exercise: 8-22 mins                    G Codes:       Governor Rooks, PTA pager [phone]    Cristela Blue 05/17/2017, 4:10 PM

## 2017-05-17 NOTE — Progress Notes (Signed)
LTM discontinued. No skin breakdown was seen. Dr Candiss Norse notified.

## 2017-05-17 NOTE — Progress Notes (Signed)
Inpatient Diabetes Program Recommendations  AACE/ADA: New Consensus Statement on Inpatient Glycemic Control (2015)  Target Ranges:  Prepandial:   less than 140 mg/dL      Peak postprandial:   less than 180 mg/dL (1-2 hours)      Critically ill patients:  140 - 180 mg/dL   Lab Results  Component Value Date   GLUCAP 232 (H) 05/17/2017   HGBA1C 13.3 (H) 05/15/2017    Review of Glycemic Control Inpatient Diabetes Program Recommendations:   Attempted to meet with patient and family to review insulin administration. Patient is in the midst of EEG study and sleeping soundly. Nurse shared daughter will probably be here around 7 pm. Reviewed with RN Saun to teach patient and family insulin pen administration using pen station on unit.  When case manager reviews patient's medication needs, will determine if patient able to afford Lantus or need to do 70/30 Novolin insulin bid (from [facility] $25 per vial). If transitioned to Novolin 70/30 insulin, 14 units bid would be approx. 20 units basal + 8 units meal coverage.  Patient has order for insulin pen starter kit and patient education videos.  Thank you, Nani Gasser. Jacquelyn Shadrick, RN, MSN, CDE  Diabetes Coordinator Inpatient Glycemic Control Team Team Pager [phone] (8am-5pm) 05/17/2017 1:20 PM

## 2017-05-17 NOTE — Progress Notes (Signed)
SLP Cancellation Note  Patient Details Name: Linda Dennis MRN: [id number] DOB: Jul 12, 1955   Cancelled treatment:       Reason Eval/Treat Not Completed: Other (comment)(Staffing; unable to see)   Elvina Sidle, M.S., CCC-SLP 05/17/2017, 3:08 PM

## 2017-05-17 NOTE — Procedures (Signed)
Electroencephalogram report- LTM  Ordering Physician : Dr. Cheral Marker    Beginning date and time: 05/16/2017 10:39 hours Ending date and time:  05/17/2017 0730 hours  Day of study: 1  Medications include: Keppra  HISTORY: This 24 hours of intensive EEG monitoring with simultaneous video monitoring was performed for this patient with altered mental status, seizure like activity, and abnormal MRI.  This EEG was requested to rule out subclinical electrographic seizures.  TECHNICAL DESCRIPTION:  The study consists of a continuous 16-channel multi-montage digital video EEG recording with twenty-one electrodes placed according to the International 10-20 System. Additional leads included eye leads, true temporal leads (T1, T2), and an EKG lead. Activation procedures were not done due to mental status.  REPORT: The background activity in this tracing consists of 8-9 Hz posterior rhythm on the left.  It is reactive to eye opening and stimulation.  The activity is asymmetric.  There is delta and theta activity seen on the right hemisphere.  Persistent 1-1.5 Hz periodic spikes and sharp waves were seen in the right hemisphere.  The discharges were more prominent in the posterior quadrant, phase reversing at P4 in the longitudinal bipolar montage.  No clear evolution was seen to suggest electrographic seizures.  There were no pushbutton activations events during this recording.   INTERPRETATION: This is an abnormal EEG due to: 1) Focal slowing and periodic epileptiform discharges in the right hemisphere  Clinical Correlation: PLEDs seen during this EEG are consistent with epileptogenic activity. No clear evolution was seen to suggest electrographic seizures, however, the ictal nature of these discharges cannot be completely ruled out.  Clinical correlation is required.

## 2017-05-17 NOTE — Progress Notes (Addendum)
PROGRESS NOTE    Linda Dennis  TMA:[id number] DOB: 1956/02/14 DOA: 05/14/2017 PCP:    Outpatient Specialists:     Brief Narrative:  Linda Dennis is a 62 y.o. female with history of hypertension diabetes mellitus who originally resides in [place] is visiting her family and has been in the [place] since March 16, 2017.  Patient has not brought her medications from home.  And has not taken any of her medications for last 2 months.  Since yesterday morning patient states she has been having a frontal headache throbbing in nature with some nausea vomiting.  At times she felt weak in the extremities but has not lost function of the extremities.  patient on exam has poor vision on the left eye which patient states is chronic and also has a cataract in the left eye.     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 -Has gliosis in the occipital lobe likely from prior CVA -And had acute worsening of her chronically decreased vision in her left eye -Appreciate neurology consult, discussed with Dr.Aroor today -Started on Keppra 500 mg twice a day due to abnormal EEG with PLEDs, will likely need this at discharge until follow-up MRI in 1-2 weeks -clinically improving, blood pressure much better, headache and confusion have resolved -LTM since yesterday, to be read today -We will request ophthalmology follow-up as outpatient, discussed with neurology today, I called Ophthalmology on call and d/w Dr.Chris Groat he will see her in office on Monday 1/7 at 1pm -CM for PCP, she is visiting from [place] hence some logistical challenges -also sent electronic referral to Neurology for Urgent FU  Hypertensive urgency -Improved and stable now -Continue amlodipine -Hydralazine when necessary -Home medications yet to be determined  Diabetes mellitus type 2 uncontrolled -lantus 10 units, increase dose, continue  SSI -HgbA1c: 13 -Diabetes coordinator consult,  not on insulin at home but definitely needs this I also talked to her daughter to learn how to administer insulin  Normocytic normochromic anemia -no old labs to compare -follow CBC -Anemia panel with iron deficiency, will give IV iron infusion today -Check Hemoccults stool, transfuse as needed if trends down further  Elevated creatinine probably chronic kidney disease -improved some with hydration -Now stable in the 1.3 range, monitor  DVT prophylaxis: SCD's  Code Status:Full Code Family Communication: No family at bedside will call and discuss with dilated today   Disposition Plan:  Home in 24-48 hours pending continuous EEG, insulin teaching etc.   Consultants:  Neuro D/w ophthalmology Dr.Chris Groat 1/4  Subjective: - feels better today, has no headache and feels clear, left eye still has vision loss in   Objective: Vitals:   05/16/17 2127 05/17/17 0038 05/17/17 0607 05/17/17 1005  BP: (!) 162/83 135/77 (!) 154/77 125/79  Pulse: 92 94 93 89  Resp: 18 18  16   Temp: 98.7 F (37.1 C)  98.8 F (37.1 C) 98.6 F (37 C)  TempSrc: Oral  Oral Oral  SpO2: 99% 99% 99% 94%    Intake/Output Summary (Last 24 hours) at 05/17/2017 1154 Last data filed at 05/17/2017 1005 Gross per 24 hour  Intake 930 ml  Output -  Net 930 ml   There were no vitals filed for this visit.  Examination:  Gen: Awake, Alert, Oriented X 3, continuous EEG leads on, pleasant, no distress HEENT: Left neglect noted, Vision loss L eye-only light perception Lungs: Good air movement bilaterally,  CTAB CVS: RRR,No Gallops,Rubs or new Murmurs Abd: soft, Non tender, non distended, BS present Extremities: No Cyanosis, Clubbing or edema Skin: no new rashes Neuro: Moves all extremities,  no localizing signs noted apart from mild left neglect and vision loss in the left   Data Reviewed: I have personally reviewed following labs and imaging  studies  CBC: Recent Labs  Lab 05/14/17 2015 05/16/17 0533 05/17/17 0351  WBC 9.2 5.2 5.5  HGB 9.2* 7.9* 7.7*  HCT 27.4* 24.6* 24.5*  MCV 82.5 83.4 83.1  PLT 386 343 761   Basic Metabolic Panel: Recent Labs  Lab 05/14/17 2015 05/15/17 2113 05/16/17 0533 05/17/17 0351  NA 130* 136 138 138  K 4.4 4.1 3.5 4.0  CL 97* 109 113* 111  CO2 23 20* 19* 21*  GLUCOSE 385* 188* 116* 210*  BUN 52* 40* 34* 34*  CREATININE 1.63* 1.33* 1.25* 1.42*  CALCIUM 9.0 8.7* 8.3* 8.5*   GFR: CrCl cannot be calculated (Unknown ideal weight.). Liver Function Tests: Recent Labs  Lab 05/14/17 2015 05/16/17 0533  AST 16 13*  ALT 15 12*  ALKPHOS 59 42  BILITOT 0.9 0.6  PROT 8.0 5.9*  ALBUMIN 3.4* 2.5*   No results for input(s): LIPASE, AMYLASE in the last 168 hours. No results for input(s): AMMONIA in the last 168 hours. Coagulation Profile: No results for input(s): INR, PROTIME in the last 168 hours. Cardiac Enzymes: No results for input(s): CKTOTAL, CKMB, CKMBINDEX, TROPONINI in the last 168 hours. BNP (last 3 results) No results for input(s): PROBNP in the last 8760 hours. HbA1C: Recent Labs    05/15/17 0208  HGBA1C 13.3*   CBG: Recent Labs  Lab 05/16/17 0626 05/16/17 1110 05/16/17 1620 05/17/17 0127 05/17/17 0621  GLUCAP 108* 164* 205* 185* 198*   Lipid Profile: No results for input(s): CHOL, HDL, LDLCALC, TRIG, CHOLHDL, LDLDIRECT in the last 72 hours. Thyroid Function Tests: No results for input(s): TSH, T4TOTAL, FREET4, T3FREE, THYROIDAB in the last 72 hours. Anemia Panel: Recent Labs    05/15/17 0208  VITAMINB12 996*  FOLATE 21.0  FERRITIN 164  TIBC 244*  IRON 37  RETICCTPCT 1.4   Urine analysis:    Component Value Date/Time   COLORURINE STRAW (A) 05/15/2017 0835   APPEARANCEUR CLEAR 05/15/2017 0835   LABSPEC 1.014 05/15/2017 0835   PHURINE 5.0 05/15/2017 0835   GLUCOSEU >=500 (A) 05/15/2017 0835   HGBUR MODERATE (A) 05/15/2017 0835   BILIRUBINUR  NEGATIVE 05/15/2017 0835   KETONESUR 5 (A) 05/15/2017 0835   PROTEINUR 100 (A) 05/15/2017 0835   NITRITE NEGATIVE 05/15/2017 0835   LEUKOCYTESUR NEGATIVE 05/15/2017 0835     )No results found for this or any previous visit (from the past 240 hour(s)).    Anti-infectives (From admission, onward)   None       Radiology Studies: No results found.      Scheduled Meds: . amLODipine  10 mg Oral Daily  . insulin aspart  0-9 Units Subcutaneous TID WC  . insulin aspart  3 Units Subcutaneous TID WC  . insulin glargine  10 Units Subcutaneous Daily  . levETIRAcetam  500 mg Oral BID   Continuous Infusions:    LOS: 2 days    Time spent: 35 min    Domenic Polite, MD [facility] Page via [email or web address] ,password [email or web address] If 7PM-7AM, please contact night-coverage  05/17/2017, 11:54 AM

## 2017-05-17 NOTE — Progress Notes (Signed)
Occupational Therapy Treatment Patient Details Name: Linda Dennis MRN: [id number] DOB: October 31, 1955 Today's Date: 05/17/2017    History of present illness Pt is a 62 y/o female admited secondary to hypertensive urgency. Pt with increased confusion. 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 Also revealed R temporal occipital meningioma. PMH includes CVA, L vision loss, and DM.    OT comments  Upon therapist arrival, pt had eaten lunch but some food remained on only left side of tray. When therapist inquired about remaining food, pt requested therapist show her where the rest of the food was so she could eat it.  Requires verbal cues for compensatory techniques to locate items on left side. With verbal prompts she was able to access remaining food on tray. Therapist facilitated grooming task with items placed on left. Continues to require verbal cues for left side attention.  Will continue to follow acutely.  Continue to recommend CIR.  Follow Up Recommendations  Supervision/Assistance - 24 hour;CIR    Equipment Recommendations  3 in 1 bedside commode    Recommendations for Other Services Rehab consult    Precautions / Restrictions Precautions Precautions: Fall       Mobility Bed Mobility Overal bed mobility: Modified Independent                Transfers Overall transfer level: Needs assistance Equipment used: None Transfers: Sit to/from Stand;Stand Pivot Transfers Sit to Stand: Min guard Stand pivot transfers: Min assist       General transfer comment: cues for hand placement    Balance                                           ADL either performed or assessed with clinical judgement   ADL Overall ADL's : Needs assistance/impaired Eating/Feeding: Supervision/ safety;Cueing for compensatory techinques Eating/Feeding Details (indicate cue type and reason): verbal cues to turn head to locate items on left side  of tray. Grooming: Dance movement psychotherapist;Wash/dry hands;Oral care;Supervision/safety;Sitting Grooming Details (indicate cue type and reason): Therapist placing items on left side of tray. Pt requiring verbal cues for 2/4 objects on tray.                 Toilet Transfer: Minimal assistance;BSC;Stand-pivot   Toileting- Clothing Manipulation and Hygiene: Minimal assistance;Sit to/from stand               Vision       Perception     Praxis      Cognition Arousal/Alertness: Awake/alert Behavior During Therapy: [facility] for tasks assessed/performed                                            Exercises     Shoulder Instructions       General Comments      Pertinent Vitals/ Pain       Pain Assessment: No/denies pain  Home Living                                          Prior Functioning/Environment              Frequency  Min 3X/week  Progress Toward Goals  OT Goals(current goals can now be found in the care plan section)  Progress towards OT goals: Progressing toward goals  Acute Rehab OT Goals Patient Stated Goal: to eventually return to [place] OT Goal Formulation: With patient Time For Goal Achievement: 05/30/17 Potential to Achieve Goals: Good ADL Goals Pt Will Perform Grooming: with set-up;sitting Pt Will Perform Upper Body Bathing: with set-up;sitting Pt Will Perform Upper Body Dressing: with set-up;sitting Pt Will Perform Lower Body Dressing: with set-up;sit to/from stand Pt Will Transfer to Toilet: with supervision;ambulating  Plan Discharge plan remains appropriate    Co-evaluation                 AM-PAC PT "6 Clicks" Daily Activity     Outcome Measure   Help from another person eating meals?: A Little Help from another person taking care of personal grooming?: A Little Help from another person toileting, which includes using toliet, bedpan, or urinal?: A Little Help from another person  bathing (including washing, rinsing, drying)?: A Little Help from another person to put on and taking off regular upper body clothing?: A Lot Help from another person to put on and taking off regular lower body clothing?: A Lot 6 Click Score: 16    End of Session    OT Visit Diagnosis: Unsteadiness on feet (R26.81)   Activity Tolerance Patient tolerated treatment well   Patient Left with call bell/phone within reach;with bed alarm set   Nurse Communication          Time: 7829-5621 OT Time Calculation (min): 14 min  Charges: OT General Charges $OT Visit: 1 Visit OT Treatments $Self Care/Home Management : 8-22 mins     Darrol Jump OTR/L 05/17/2017, 2:43 PM

## 2017-05-17 NOTE — Progress Notes (Signed)
Reason for consult:  AMS, vision loss   Subjective: Continue to have improvement in vision, less neglect.    ROS: negative except above   Examination  Vital signs in last 24 hours: Temp:  [97.7 F (36.5 C)-98.8 F (37.1 C)] 98.7 F (37.1 C) (01/04 1316) Pulse Rate:  [88-94] 88 (01/04 1316) Resp:  [16-18] 16 (01/04 1316) BP: (125-162)/(76-88) 143/76 (01/04 1316) SpO2:  [94 %-99 %] 96 % (01/04 1316)  General: Not in distress, cooperative CVS: pulse-normal rate and rhythm RS: breathing comfortably Extremities: normal   Neuro: MS: Alert, oriented, follows command CN: pupils equal and reactive VF: appear to be preserved on right eye, patient unable to see from left eye clearly  Motor: 5/5 strength in all 4 extremities Reflexes: 2+ bilaterally over patella, biceps, plantars: flexor Coordination: normal Gait: not tested  Basic Metabolic Panel: Recent Labs  Lab 05/14/17 2015 05/15/17 2113 05/16/17 0533 05/17/17 0351  NA 130* 136 138 138  K 4.4 4.1 3.5 4.0  CL 97* 109 113* 111  CO2 23 20* 19* 21*  GLUCOSE 385* 188* 116* 210*  BUN 52* 40* 34* 34*  CREATININE 1.63* 1.33* 1.25* 1.42*  CALCIUM 9.0 8.7* 8.3* 8.5*    CBC: Recent Labs  Lab 05/14/17 2015 05/16/17 0533 05/17/17 0351  WBC 9.2 5.2 5.5  HGB 9.2* 7.9* 7.7*  HCT 27.4* 24.6* 24.5*  MCV 82.5 83.4 83.1  PLT 386 343 335     Coagulation Studies: No results for input(s): LABPROT, INR in the last 72 hours.  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  MRI shows R temporo occipital  meningioma  plus right occipital gliosis vs vasogenic edema. Routine  EEG shows PLEDS over the right hemisphere in addition to focal slowing on the right  .LTM EEG connected - no elctrographic seizures, shows focal slowing and periodic epileptiform discharges in the right hemisphere. Mental status improving with control of BP/ visual symptoms also improving in right eye.   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    Recommendations: 1.Continue Keppra  500 mg BID.  2. Repeat MRI brain in 2-3 weeks as outpatient  to assess for possible reversal of the right parietofrontal signal abnormality (to differentiate PRES vs old stroke) 3. BP management. Goal BP <140/90.  Should be discharged on an optimized regimen. 4.NS follow up for meningioma.  5. Ophthalmology consult.  Outpatient neurology follow up.  Karena Addison Jaylena Holloway [facility] Pager Number [phone] For questions after 7pm please refer to AMION to reach the Neurologist on call

## 2017-05-17 NOTE — Care Management Note (Signed)
Case Management Note  Patient Details  Name: Linda Dennis MRN: [id number] Date of Birth: 1956/03/06  Subjective/Objective:  Pt in with hypertensive emergency. She is originally from [place] but in [place] with her daughter. Pt does not have insurance and no PCP.                   Action/Plan: CM consulted for medication assistance. CM was able to obtain her an appointment at [facility]. Information on the AVS. Per DM notes patient may need to be on 70/30 d/t cost. CM spoke to patients daughter and she was interested in the cheaper route for her insulin. MD updated. If patient discharges over the weekend she will need [place] letter.  CM following.  Expected Discharge Date:                  Expected Discharge Plan:     In-House Referral:     Discharge planning Services  CM Consult, [facility], Medication Assistance  Post Acute Care Choice:    Choice offered to:     DME Arranged:    DME Agency:     HH Arranged:    HH Agency:     Status of Service:  In process, will continue to follow  If discussed at Long Length of Stay Meetings, dates discussed:    Additional Comments:  Pollie Friar, RN 05/17/2017, 3:48 PM

## 2017-05-18 LAB — CBC
HEMATOCRIT: 24.7 % — AB (ref 36.0–46.0)
HEMOGLOBIN: 7.9 g/dL — AB (ref 12.0–15.0)
MCH: 26.6 pg (ref 26.0–34.0)
MCHC: 32 g/dL (ref 30.0–36.0)
MCV: 83.2 fL (ref 78.0–100.0)
Platelets: 360 10*3/uL (ref 150–400)
RBC: 2.97 MIL/uL — ABNORMAL LOW (ref 3.87–5.11)
RDW: 13 % (ref 11.5–15.5)
WBC: 5.4 10*3/uL (ref 4.0–10.5)

## 2017-05-18 LAB — BASIC METABOLIC PANEL
ANION GAP: 7 (ref 5–15)
BUN: 31 mg/dL — ABNORMAL HIGH (ref 6–20)
CALCIUM: 8.4 mg/dL — AB (ref 8.9–10.3)
CHLORIDE: 109 mmol/L (ref 101–111)
CO2: 21 mmol/L — AB (ref 22–32)
Creatinine, Ser: 1.3 mg/dL — ABNORMAL HIGH (ref 0.44–1.00)
GFR calc Af Amer: 50 mL/min — ABNORMAL LOW (ref >60)
GFR calc non Af Amer: 43 mL/min — ABNORMAL LOW (ref >60)
Glucose, Bld: 118 mg/dL — ABNORMAL HIGH (ref 65–99)
Potassium: 3.8 mmol/L (ref 3.5–5.1)
Sodium: 137 mmol/L (ref 135–145)

## 2017-05-18 LAB — GLUCOSE, CAPILLARY
GLUCOSE-CAPILLARY: 233 mg/dL — AB (ref 65–99)
Glucose-Capillary: 123 mg/dL — ABNORMAL HIGH (ref 65–99)
Glucose-Capillary: 166 mg/dL — ABNORMAL HIGH (ref 65–99)
Glucose-Capillary: 160 mg/dL — ABNORMAL HIGH (ref 65–99)

## 2017-05-18 NOTE — Progress Notes (Signed)
PROGRESS NOTE    Linda Dennis  DZH:[id number] DOB: [birth year] DOA: 05/14/2017 PCP:    Outpatient Specialists:     Brief Narrative:  62 y.o. fem hypertension diabetes mellitus who originally resides in [place] is visiting her family and has been in the [place] since March 16, 2017. not taken any of her medications for last 2 months. Admit with frontal headache throbbing in nature with some nausea vomiting.  At times she felt weak in the extremities but has not lost function of the extremities.  patient on exam has poor vision on the left eye which patient states is chronic and also has a cataract in the left eye.     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 -cont Keppra 500 mg twice a day due to abnormal EEG with PLEDs, will likely need this at discharge until follow-up MRI in 1-2 weeks -clinically improving, blood pressure much better, headache and confusion have resolved -LTM suggestive of possible Sz-neuro to comment - Ophthalmology  Dr.Chris Katy Fitch will see her in office on Monday 1/7 at 1pm -also sent electronic referral to Neurology for Urgent FU  Hypertensive urgency, improved now -Continue amlodipine -Hydralazine when necessary -Home medications yet to be determined  Diabetes mellitus type 2 uncontrolled -lantus 10 units, increase dose, continue SSI -HgbA1c: 13 -Diabetes coordinator consult,  not on insulin at home, CBG 118-123  Normocytic normochromic anemia -no old labs to compare -follow CBC -Anemia panel with iron deficiency, will give IV iron infusion today -Check Hemoccults stool, transfuse as needed if trends down further  Elevated creatinine probably chronic kidney disease -improved some with hydration -Now stable in the 1.3 range, monitor  DVT prophylaxis: SCD's  Code Status:Full Code Family Communication: No family at bedside will call and discuss with dilated  today   Disposition Plan:     Consultants:  Neuro D/w ophthalmology Dr.Chris Groat 1/4  Subjective:  Still poor vision L side  Other wsie well in nad Eating drinking hasnt given herself insulin  No cp No n/v  Objective: Vitals:   05/17/17 2102 05/18/17 0101 05/18/17 0527 05/18/17 0933  BP: (!) 157/87 (!) 149/76 (!) 146/74 126/66  Pulse: 93 81 94 93  Resp: 20 20 20 20   Temp: 98.5 F (36.9 C) 98.1 F (36.7 C) 99.6 F (37.6 C) 98.7 F (37.1 C)  TempSrc: Oral Oral Oral Oral  SpO2: 96% 98% 98% 96%    Intake/Output Summary (Last 24 hours) at 05/18/2017 1055 Last data filed at 05/18/2017 0300 Gross per 24 hour  Intake 240 ml  Output 150 ml  Net 90 ml   There were no vitals filed for this visit.  Examination:  awake alert in nad eomi ncat vision testing deferred cta b clear abd soft nad Power 5/5, intact reflexes. sesory   Data Reviewed: I have personally reviewed following labs and imaging studies  CBC: Recent Labs  Lab 05/14/17 2015 05/16/17 0533 05/17/17 0351 05/18/17 0408  WBC 9.2 5.2 5.5 5.4  HGB 9.2* 7.9* 7.7* 7.9*  HCT 27.4* 24.6* 24.5* 24.7*  MCV 82.5 83.4 83.1 83.2  PLT 386 343 335 419   Basic Metabolic Panel: Recent Labs  Lab 05/14/17 2015 05/15/17 2113 05/16/17 0533 05/17/17 0351 05/18/17 0408  NA 130* 136 138 138 137  K 4.4 4.1 3.5 4.0 3.8  CL 97* 109 113* 111 109  CO2 23 20* 19* 21* 21*  GLUCOSE 385* 188* 116* 210*  118*  BUN 52* 40* 34* 34* 31*  CREATININE 1.63* 1.33* 1.25* 1.42* 1.30*  CALCIUM 9.0 8.7* 8.3* 8.5* 8.4*   GFR: CrCl cannot be calculated (Unknown ideal weight.). Liver Function Tests: Recent Labs  Lab 05/14/17 2015 05/16/17 0533  AST 16 13*  ALT 15 12*  ALKPHOS 59 42  BILITOT 0.9 0.6  PROT 8.0 5.9*  ALBUMIN 3.4* 2.5*   No results for input(s): LIPASE, AMYLASE in the last 168 hours. No results for input(s): AMMONIA in the last 168 hours. Coagulation Profile: No results for input(s): INR, PROTIME in the  last 168 hours. Cardiac Enzymes: No results for input(s): CKTOTAL, CKMB, CKMBINDEX, TROPONINI in the last 168 hours. BNP (last 3 results) No results for input(s): PROBNP in the last 8760 hours. HbA1C: No results for input(s): HGBA1C in the last 72 hours. CBG: Recent Labs  Lab 05/17/17 0621 05/17/17 1110 05/17/17 1627 05/17/17 2056 05/18/17 0601  GLUCAP 198* 232* 104* 174* 123*   Lipid Profile: No results for input(s): CHOL, HDL, LDLCALC, TRIG, CHOLHDL, LDLDIRECT in the last 72 hours. Thyroid Function Tests: No results for input(s): TSH, T4TOTAL, FREET4, T3FREE, THYROIDAB in the last 72 hours. Anemia Panel: No results for input(s): VITAMINB12, FOLATE, FERRITIN, TIBC, IRON, RETICCTPCT in the last 72 hours. Urine analysis:    Component Value Date/Time   COLORURINE STRAW (A) 05/15/2017 [place] 05/15/2017 0835   LABSPEC 1.014 05/15/2017 0835   PHURINE 5.0 05/15/2017 0835   GLUCOSEU >=500 (A) 05/15/2017 0835   HGBUR MODERATE (A) 05/15/2017 0835   BILIRUBINUR NEGATIVE 05/15/2017 0835   KETONESUR 5 (A) 05/15/2017 0835   PROTEINUR 100 (A) 05/15/2017 0835   NITRITE NEGATIVE 05/15/2017 0835   LEUKOCYTESUR NEGATIVE 05/15/2017 0835     )No results found for this or any previous visit (from the past 240 hour(s)).    Anti-infectives (From admission, onward)   None       Radiology Studies: No results found.      Scheduled Meds: . amLODipine  10 mg Oral Daily  . insulin aspart  0-9 Units Subcutaneous TID WC  . insulin aspart  3 Units Subcutaneous TID WC  . insulin glargine  20 Units Subcutaneous Daily  . levETIRAcetam  500 mg Oral BID   Continuous Infusions:    LOS: 3 days    Time spent: 35 min  Verneita Griffes, MD [facility] [phone]  05/18/2017, 10:55 AM

## 2017-05-19 LAB — GLUCOSE, CAPILLARY
GLUCOSE-CAPILLARY: 130 mg/dL — AB (ref 65–99)
Glucose-Capillary: 142 mg/dL — ABNORMAL HIGH (ref 65–99)

## 2017-05-19 MED ORDER — INSULIN GLARGINE 100 UNIT/ML ~~LOC~~ SOLN: 20.0000 [IU] | Freq: Every day | SUBCUTANEOUS | 11 refills | Status: DC

## 2017-05-19 MED ORDER — INSULIN STARTER KIT- PEN NEEDLES (SPANISH): 1.0000 | Freq: Once | 0 refills | Status: AC

## 2017-05-19 MED ORDER — GLUCOSE BLOOD VI STRP: ORAL_STRIP | 12 refills | Status: DC

## 2017-05-19 MED ORDER — INSULIN ASPART 100 UNIT/ML ~~LOC~~ SOLN: 3.0000 [IU] | Freq: Three times a day (TID) | SUBCUTANEOUS | 11 refills | Status: DC

## 2017-05-19 MED ORDER — AMLODIPINE BESYLATE 10 MG PO TABS: 10.0000 mg | ORAL_TABLET | Freq: Every day | ORAL | 0 refills | Status: DC

## 2017-05-19 MED ORDER — INSULIN STARTER KIT- SYRINGES (ENGLISH): 1.0000 | Freq: Once | 0 refills | Status: AC

## 2017-05-19 MED ORDER — LEVETIRACETAM 500 MG PO TABS: 500.0000 mg | ORAL_TABLET | Freq: Two times a day (BID) | ORAL | 1 refills | Status: DC

## 2017-05-19 NOTE — Progress Notes (Signed)
Discharge instructions given. Pt verbalized understanding and all questions were answered.  

## 2017-05-19 NOTE — Discharge Summary (Signed)
Physician Discharge Summary  Linda Dennis NTZ:[id number] DOB: 1955/11/10 DOA: 05/14/2017  PCP: No primary care provider on file.  Admit date: 05/14/2017 Discharge date: 05/19/2017  Time spent: 35 minutes  Recommendations for Outpatient Follow-up:  1. New initation to insulin-given Rx for glucometer-strips-syringes and insulin 2. Needs to follow Dr. Katy Fitch as OP 1/7 at 13:00-family aware 3. Needs A1c in 3 months 4. recommned OP colonoscopy on d/c  Discharge Diagnoses:  Active Problems:   Hypertensive urgency   Uncontrolled type 2 diabetes mellitus with hyperglycemia (HCC)   Normocytic normochromic anemia   Discharge Condition: improved  Diet recommendation: heart healthy diabetic and will need education going forward  There were no vitals filed for this visit.  History of present illness:  62 y.o.fem hypertension diabetes mellitus who originally resides in [place] is visiting her family and has been in the [place] since March 16, 2017. not taken any of her medications for last 2 months. Admit with frontal headache throbbing in nature with some nausea vomiting. At times she felt weak in the extremities but has not lost function of the extremities. patient on exam has poor vision on the left eye which patient states is chronic and also has a cataract in the left eye.     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 -cont Keppra 500 mg twice a day due to abnormal EEG with PLEDs, will likely need this at discharge until follow-up MRI in 1-2 weeks -clinically improving, blood pressure much better, headache and confusion have resolved -LTM suggestive of possible Sz-neuro to comment - Ophthalmology  Dr.Chris Katy Fitch will see her in office on Monday 1/7 at 1pm -also sent electronic referral to Neurology for Urgent FU  Hypertensive urgency, improved now -Continue amlodipine 10 -Hydralazine when  necessary -suggest OP check for microalbuminuria and addition low dose ACE as OP  Diabetes mellitus type 2 uncontrolled-relatively new onset -lantus 10 units, continue SSI 3 u before meals tid, ordered glucometer and supplies syringes as well as needles and started kit on d/c home -HgbA1c: 13 -Diabetes coordinator consult, CBG less than 150's  Normocytic normochromic anemia-no old labs to compare -follow CBC -Anemia panel with iron deficiency, will give IV iron infusion today -hemoccults neg-Nee dOP colonoscopy on d/c  Elevated creatinine probably chronic kidney disease -improved some with hydration -Now stable in the 1.3 range, monitor    Procedures: eeg mri  Consultations:  neuro  Discharge Exam: Vitals:   05/19/17 0624 05/19/17 1020  BP: 121/62 (!) 149/77  Pulse: 85 91  Resp: 20 17  Temp: 98.6 F (37 C) 98.3 F (36.8 C)  SpO2: 99% 99%    General: awake alert no weakness and in nad Cardiovascular: s1 s 2no m/r/g, rrr Respiratory: clear no added sound abd soft Neuro gorssly intact  Discharge Instructions   Discharge Instructions    Ambulatory referral to Neurology   Complete by:  As directed    An appointment is requested in approximately 1 week for PRES vs Occipital seizures, also needs repeat MRI in 2weeks:   Ambulatory referral to Neurology   Complete by:  As directed    An appointment is requested in approximately: 1 WEEK FOR PRES vs Occipital seizures   Diet - low sodium heart healthy   Complete by:  As directed    Discharge instructions   Complete by:  As directed    Will prescribe a meter as well as supplies for insulin You  will need to keep the appointment with the Eye doctor on 1/7--do not miss this Pleas emake sure you follow instrucitons on blood sugar monitoring carefully   For home use only DME Glucometer   Complete by:  As directed    Increase activity slowly   Complete by:  As directed      Allergies as of 05/19/2017   No Known  Allergies     Medication List    TAKE these medications   amLODipine 10 MG tablet Commonly known as:  NORVASC Take 1 tablet (10 mg total) by mouth daily. Start taking on:  05/20/2017   glucose blood test strip Use as instructed   insulin aspart 100 UNIT/ML injection Commonly known as:  novoLOG Inject 3 Units into the skin 3 (three) times daily with meals.   insulin glargine 100 UNIT/ML injection Commonly known as:  LANTUS Inject 0.2 mLs (20 Units total) into the skin daily. Start taking on:  05/20/2017   insulin starter kit- pen needles Misc 1 kit by Other route once for 1 dose.   insulin starter kit- syringes Misc 1 kit by Other route once for 1 dose.   levETIRAcetam 500 MG tablet Commonly known as:  KEPPRA Take 1 tablet (500 mg total) by mouth 2 (two) times daily.            Durable Medical Equipment  (From admission, onward)        Start     Ordered   05/19/17 0000  For home use only DME Glucometer     05/19/17 1141     No Known Allergies Follow-up Information    Warden Fillers, MD Follow up on 05/20/2017.   Specialty:  Ophthalmology Why:  at 1pm, please DO NOT [place] information: [address] [place] [id number] [phone]        [facility] Follow up on 05/28/2017.   Why:  Your appointment time is 9 am. Please arrive 15 min early and bring a picture ID and your current medications. Contact information: [address] [place], [place] [address]  [phone]       [facility] Follow up.   Why:  May use this location for pharmacy needs.  Contact information: [address]  [place] [id number] [phone]           The results of significant diagnostics from this hospitalization (including imaging, microbiology, ancillary and laboratory) are listed below for reference.    Significant Diagnostic Studies: Ct Head Wo Contrast  Result  Date: 05/15/2017 CLINICAL DATA:  Headache beginning this morning. Confusion. History of stroke and diabetes. EXAM: CT HEAD WITHOUT CONTRAST TECHNIQUE: Contiguous axial images were obtained from the base of the skull through the vertex without intravenous contrast. COMPARISON:  None. FINDINGS: Brain: Mild cerebral atrophy. No ventricular dilatation. Low-attenuation changes in the deep white matter are nonspecific but likely due to small vessel ischemia. Focal areas of low-attenuation demonstrated in a right posterior parietal lobe and left frontal and parietal lobes. These could represent old infarcts but margins are not well defined and acute infarct and 1 or more locations could also be present. MRI would be more sensitive for evaluation of acute ischemia. No mass effect or midline shift. No abnormal extra-axial fluid collections. Gray-white matter junctions are distinct. Basal cisterns are not effaced. No acute intracranial hemorrhage. There is a nodular extra-axial calcification along the right posterior parietal dural surface measuring  1.5 cm diameter, likely representing a calcified meningioma. Vascular: Internal carotid artery vascular calcifications are present. Skull: Normal. Negative for fracture or focal lesion. Sinuses/Orbits: No acute finding. 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 IMPRESSION: 1. Focal areas of low-attenuation in the right posterior parietal lobe and left frontal and parietal lobes. These could represent acute or chronic ischemic changes. MRI suggested for more definitive evaluation if clinically indicated. 2. No acute intracranial hemorrhage. 3. Mild cerebral atrophy and small vessel ischemic changes. 4. 15 mm right parietal calcified meningioma. Electronically Signed   By: Lucienne Capers M.D.   On: 05/15/2017 00:21   Mr Brain Wo Contrast  Result Date: 05/15/2017 CLINICAL DATA:  Initial evaluation for acute headache with confusion. EXAM: MRI HEAD  WITHOUT CONTRAST TECHNIQUE: Multiplanar, multiecho pulse sequences of the brain and surrounding structures were obtained without intravenous contrast. COMPARISON:  Prior CT from earlier same day. FINDINGS: Brain: Age related cerebral atrophy. Scattered patchy T2/FLAIR hyperintensities seen involving the periventricular, deep, and subcortical white matter both cerebral hemispheres, nonspecific, but likely related to chronic microvascular changes. Small remote left frontal and right occipital lobe infarct CED. Probable additional small remote left parietal lobe infarct. No abnormal foci of restricted diffusion to suggest acute or subacute ischemia. Punctate focus of diffusion abnormality within the left periatrial white matter noted, demonstrates no associated T2 correlate and is felt to be most consistent with T2 shine through. Gray-white matter differentiation maintained. No evidence for acute intracranial hemorrhage. Well-circumscribed 13 mm extra-axial lesion at the right temporal occipital region consistent with a meningioma. This appears densely calcified on prior CT. No associated edema or mass effect. No other mass lesion. No mass effect or midline shift. No hydrocephalus. No extra-axial fluid collection. Pituitary gland suprasellar region normal. Midline structures intact. Vascular: Major intracranial vascular flow voids are maintained. Skull and upper cervical spine: Craniocervical junction normal. Degenerative disc bulging noted at C3-4 with resultant mild to moderate stenosis. Bone marrow signal intensity within normal limits. No scalp soft tissue abnormality. Sinuses/Orbits: Globes and orbital soft tissues within normal limits. Paranasal sinuses grossly clear, limited evaluation by susceptibility artifact. Small left mastoid effusion. Inner ear structures normal. Other: None. IMPRESSION: 1. No acute intracranial abnormality. 2. Small remote left frontal and occipital lobe infarcts, with additional  probable small remote left parietal infarct. 3. Patchy white matter changes involving the supratentorial cerebral white matter, nonspecific, but most commonly related to chronic small vessel ischemic disease. 4. 13 mm right temporal occipital meningioma without associated mass effect. Electronically Signed   By: Jeannine Boga M.D.   On: 05/15/2017 05:07    Microbiology: No results found for this or any previous visit (from the past 240 hour(s)).   Labs: Basic Metabolic Panel: Recent Labs  Lab 05/14/17 2015 05/15/17 2113 05/16/17 0533 05/17/17 0351 05/18/17 0408  NA 130* 136 138 138 137  K 4.4 4.1 3.5 4.0 3.8  CL 97* 109 113* 111 109  CO2 23 20* 19* 21* 21*  GLUCOSE 385* 188* 116* 210* 118*  BUN 52* 40* 34* 34* 31*  CREATININE 1.63* 1.33* 1.25* 1.42* 1.30*  CALCIUM 9.0 8.7* 8.3* 8.5* 8.4*   Liver Function Tests: Recent Labs  Lab 05/14/17 2015 05/16/17 0533  AST 16 13*  ALT 15 12*  ALKPHOS 59 42  BILITOT 0.9 0.6  PROT 8.0 5.9*  ALBUMIN 3.4* 2.5*   No results for input(s): LIPASE, AMYLASE in the last 168 hours. No results for input(s): AMMONIA in the last  168 hours. CBC: Recent Labs  Lab 05/14/17 2015 05/16/17 0533 05/17/17 0351 05/18/17 0408  WBC 9.2 5.2 5.5 5.4  HGB 9.2* 7.9* 7.7* 7.9*  HCT 27.4* 24.6* 24.5* 24.7*  MCV 82.5 83.4 83.1 83.2  PLT 386 343 335 360   Cardiac Enzymes: No results for input(s): CKTOTAL, CKMB, CKMBINDEX, TROPONINI in the last 168 hours. BNP: BNP (last 3 results) No results for input(s): BNP in the last 8760 hours.  ProBNP (last 3 results) No results for input(s): PROBNP in the last 8760 hours.  CBG: Recent Labs  Lab 05/18/17 1109 05/18/17 1741 05/18/17 2232 05/19/17 0627 05/19/17 1128  GLUCAP 166* 233* 160* 142* 130*       Signed:  Nita Sells MD   [facility] 05/19/2017, 2:43 PM

## 2017-05-19 NOTE — Care Management (Signed)
[place] letter given to pt's BS nurse and explained.  Pt may obtain diabetic supplies at [facility] tomorrow as needed.  RN can give patient a few syringes to take home.

## 2017-05-19 NOTE — Progress Notes (Signed)
RN was able to teach insulin administration to patient's daughter using the insulin pen administration. Daughter was able to give the evening insulin. Pt and daughter also watched the education videos.

## 2017-05-19 NOTE — Progress Notes (Signed)
Occupational Therapy Treatment Patient Details Name: Linda Dennis MRN: [id number] DOB: 1955-06-01 Today's Date: 05/19/2017    History of present illness Pt is a 62 y/o female admited secondary to hypertensive urgency. Pt with increased confusion. 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 Also revealed R temporal occipital meningioma. PMH includes CVA, L vision loss, and DM.    OT comments  Pt progressing towards acute OT goals. Noted that pt is for d/c home today. Focus of session was household distance functional mobility, toilet transfer, grooming tasks. Discussed fall prevention and visual compensation strategies including visual anchors during head turns. Pt noted to have a tendency to veer to the left during mobility then overcorrect to the right. Would benefit from continued therapies after d/c.    Follow Up Recommendations  Home health OT;Supervision/Assistance - 24 hour    Equipment Recommendations  3 in 1 bedside commode    Recommendations for Other Services      Precautions / Restrictions Precautions Precautions: Fall Restrictions Weight Bearing Restrictions: No       Mobility Bed Mobility Overal bed mobility: Modified Independent Bed Mobility: Supine to Sit;Sit to Supine              Transfers Overall transfer level: Needs assistance Equipment used: None Transfers: Sit to/from Stand Sit to Stand: Min guard         General transfer comment: min guard for safety    Balance Overall balance assessment: Needs assistance Sitting-balance support: No upper extremity supported;Feet supported Sitting balance-Leahy Scale: Good     Standing balance support: During functional activity;Single extremity supported Standing balance-Leahy Scale: Fair Standing balance comment: stood at sink to complete 2 grooming tasks. Single extremity support at times.                           ADL either performed or assessed  with clinical judgement   ADL Overall ADL's : Needs assistance/impaired     Grooming: Wash/dry face;Wash/dry hands;Oral care;Supervision/safety;Standing Grooming Details (indicate cue type and reason): encouraged anchor technique to scan for grooming items on right side of sink.                  Toilet Transfer: Min guard;Ambulation           Functional mobility during ADLs: Min guard General ADL Comments: Pt completed bed mobility, 2 grooming tasks standing at sink, toilet transfer and household distance functional mobility. Pt with tendency to veer to left then overcorrect to right. Encouraged incorporating head turns into ambulation. Discussed fall prevention strategies for impaired vision for safety in the home.      Vision   Vision Assessment?: Vision impaired- to be further tested in functional context   Perception     Praxis      Cognition Arousal/Alertness: Awake/alert Behavior During Therapy: WFL for tasks assessed/performed Overall Cognitive Status: Difficult to assess                                          Exercises     Shoulder Instructions       General Comments No family present but family did call during session. Pt reports she has family assistance at home. Discussed having family assist with medications.    Pertinent Vitals/ Pain       Pain Assessment:  Faces Faces Pain Scale: No hurt  Home Living                                          Prior Functioning/Environment              Frequency  Min 3X/week        Progress Toward Goals  OT Goals(current goals can now be found in the care plan section)  Progress towards OT goals: Progressing toward goals  Acute Rehab OT Goals Patient Stated Goal: to eventually return to [place] OT Goal Formulation: With patient Time For Goal Achievement: 05/30/17 Potential to Achieve Goals: Good ADL Goals Pt Will Perform Grooming: with set-up;sitting Pt Will  Perform Upper Body Bathing: with set-up;sitting Pt Will Perform Upper Body Dressing: with set-up;sitting Pt Will Perform Lower Body Dressing: with set-up;sit to/from stand Pt Will Transfer to Toilet: with supervision;ambulating  Plan Discharge plan needs to be updated    Co-evaluation                 AM-PAC PT "6 Clicks" Daily Activity     Outcome Measure   Help from another person eating meals?: None Help from another person taking care of personal grooming?: A Little Help from another person toileting, which includes using toliet, bedpan, or urinal?: A Little Help from another person bathing (including washing, rinsing, drying)?: A Little Help from another person to put on and taking off regular upper body clothing?: A Little Help from another person to put on and taking off regular lower body clothing?: A Lot 6 Click Score: 18    End of Session    OT Visit Diagnosis: Unsteadiness on feet (R26.81)   Activity Tolerance Patient tolerated treatment well   Patient Left in bed;with call bell/phone within reach;with bed alarm set   Nurse Communication Mobility status        Time: 9702-6378 OT Time Calculation (min): 17 min  Charges: OT General Charges $OT Visit: 1 Visit OT Treatments $Self Care/Home Management : 8-22 mins     Hortencia Pilar 05/19/2017, 3:57 PM

## 2017-05-28 ENCOUNTER — Encounter: Payer: Self-pay | Admitting: Family Medicine

## 2017-05-28 ENCOUNTER — Ambulatory Visit (INDEPENDENT_AMBULATORY_CARE_PROVIDER_SITE_OTHER): Payer: Self-pay | Admitting: Family Medicine

## 2017-05-28 VITALS — BP 148/78 | HR 90 | Temp 98.6°F | Resp 14 | Wt 111.4 lb

## 2017-05-28 DIAGNOSIS — D509 Iron deficiency anemia, unspecified: Secondary | ICD-10-CM

## 2017-05-28 DIAGNOSIS — E118 Type 2 diabetes mellitus with unspecified complications: Secondary | ICD-10-CM

## 2017-05-28 DIAGNOSIS — N183 Chronic kidney disease, stage 3 unspecified: Secondary | ICD-10-CM

## 2017-05-28 DIAGNOSIS — I10 Essential (primary) hypertension: Secondary | ICD-10-CM

## 2017-05-28 LAB — POCT URINALYSIS DIP (DEVICE)
BILIRUBIN URINE: NEGATIVE
Glucose, UA: 250 mg/dL — AB
KETONES UR: NEGATIVE mg/dL
Leukocytes, UA: NEGATIVE
NITRITE: NEGATIVE
PH: 6 (ref 5.0–8.0)
Protein, ur: >=300 mg/dL — AB
Specific Gravity, Urine: 1.015 (ref 1.005–1.030)
Urobilinogen, UA: 0.2 mg/dL (ref 0.0–1.0)

## 2017-05-28 LAB — GLUCOSE, CAPILLARY: Glucose-Capillary: 239 mg/dL — ABNORMAL HIGH (ref 65–99)

## 2017-05-28 MED ORDER — LEVETIRACETAM 500 MG PO TABS: 500.0000 mg | ORAL_TABLET | Freq: Two times a day (BID) | ORAL | 1 refills | Status: DC

## 2017-05-28 MED ORDER — INSULIN GLARGINE 100 UNIT/ML ~~LOC~~ SOLN: 20.0000 [IU] | Freq: Every day | SUBCUTANEOUS | 11 refills | Status: DC

## 2017-05-28 MED ORDER — AMLODIPINE BESYLATE 10 MG PO TABS: 10.0000 mg | ORAL_TABLET | Freq: Every day | ORAL | 0 refills | Status: DC

## 2017-05-28 MED ORDER — INSULIN ASPART 100 UNIT/ML ~~LOC~~ SOLN: 3.0000 [IU] | Freq: Three times a day (TID) | SUBCUTANEOUS | 11 refills | Status: DC

## 2017-05-28 MED ORDER — "INSULIN SYRINGE 31G X 5/16"" 0.5 ML MISC": 3.0000 | 1 refills | Status: DC | PRN

## 2017-05-28 MED ORDER — HYDROCHLOROTHIAZIDE 12.5 MG PO CAPS: 12.5000 mg | ORAL_CAPSULE | Freq: Every day | ORAL | 1 refills | Status: DC

## 2017-05-28 NOTE — Progress Notes (Signed)
Patient ID: Anastasija Anfinson, female    DOB: Nov 24, 1955, 62 y.o.   MRN: [id number]  PCP: Scot Jun, FNP  Chief Complaint  Patient presents with  . Establish Care  . Hospitalization Follow-up    Subjective:  HPI Britaney Espaillat is a 62 y.o. female with a history of diabetes, seizures, hypertension, presents to establish care and for hospital follow-up. Jahne is traveling to the [place] from [place].  She is accompanied by her sister who is assisting with interpretation as well as her nephew. On 05/14/2017, she presented to the [facility] with altered mental status, complaint of worsening vision, headache with nausea and vomiting, and generalized weakness.  She was found to be extremely hypertensive and hypoglycemic.  Her systolic blood pressure on arrival was consistently greater than 200 and blood sugar was over 300.  Since arriving in the [place] in November she had not taken any of her medications.  She was also found to have elevated creatinine, denies any prior diagnosis of kidney disease. She remained hospitalized for a total of 5 days.  CT and MRI of the head were both negative.   diabetes was stabilized with sliding scale insulin.  A1c 13.3.  She was aware that she had diabetes however inconsistently has taken medication while living in [place].  She was discharged after 6 days.  Since discharge she has followed up and been seen by  ophthalmologist Dr. Katy Fitch.  Vision loss to the left eye is secondary to a severe cataract.  She has cataracts in both eyes however the left eye is very significant and is causing loss of vision at this time.  She has no health insurance and due to cost is unable to proceed with any form of corrective surgery.  She reports constant draining of her eyes although denies any orbital pain.  She is consistently taking her medications now.  She does not have a blood pressure cuff and has not checked her blood pressure since resuming antihypertension medications.  She  denies any shortness of breath, chest pain, dizziness, or headache.  She has had some persistent ankle swelling since leaving the hospital.  Uncertain if she has taken amlodipine previously in [place].  Amlodipine at times can cause lower extremity edema.  She is elevating her legs when sitting in a dependent position to reduce swelling.  Currently not prescribed diiuretic therapy. Social History   Socioeconomic History  . Marital status: Single    Spouse name: Not on file  . Number of children: Not on file  . Years of education: Not on file  . Highest education level: Not on file  Social Needs  . Financial resource strain: Not on file  . Food insecurity - worry: Not on file  . Food insecurity - inability: Not on file  . Transportation needs - medical: Not on file  . Transportation needs - non-medical: Not on file  Occupational History  . Not on file  Tobacco Use  . Smoking status: Never Smoker  . Smokeless tobacco: Never Used  Substance and Sexual Activity  . Alcohol use: No    Frequency: Never  . Drug use: No  . Sexual activity: Not on file  Other Topics Concern  . Not on file  Social History Narrative  . Not on file    Family History  Family history unknown as patient is from [place] were limited healthcare is available.  Reports unknown of any family history of heart disease, cancer, lung disease, or diabetes.  Review of Systems  Constitutional: Positive for fatigue.  Respiratory: Negative.   Cardiovascular: Negative.   Gastrointestinal: Negative.   Endocrine: Positive for polyuria.  Genitourinary: Negative.   Neurological: Negative.   Psychiatric/Behavioral: Negative.       Patient Active Problem List   Diagnosis Date Noted  . Hypertensive urgency 05/15/2017  . Uncontrolled type 2 diabetes mellitus with hyperglycemia ([place]) 05/15/2017  . Normocytic normochromic anemia 05/15/2017  . Altered mental status     No Known Allergies  Prior to Admission  medications   Medication Sig Start Date End Date Taking? Authorizing Provider  ACCU-CHEK SOFTCLIX LANCETS lancets 3 each by Other route daily. 05/21/17   [provider]  amLODipine (NORVASC) 10 MG tablet Take 1 tablet (10 mg total) by mouth daily. 05/20/17   Nita Sells, MD  glucose blood test strip Use as instructed 05/19/17   Nita Sells, MD  insulin aspart (NOVOLOG) 100 UNIT/ML injection Inject 3 Units into the skin 3 (three) times daily with meals. 05/19/17   Nita Sells, MD  insulin glargine (LANTUS) 100 UNIT/ML injection Inject 0.2 mLs (20 Units total) into the skin daily. 05/20/17   Nita Sells, MD  Insulin Syringe-Needle U-100 (INSULIN SYRINGE .5CC/31GX5/16") 31G X 5/16" 0.5 ML MISC Inject 3 strips as directed as needed. 05/21/17   [provider]  levETIRAcetam (KEPPRA) 500 MG tablet Take 1 tablet (500 mg total) by mouth 2 (two) times daily. 05/19/17   Nita Sells, MD    Past Medical, Surgical Family and Social History reviewed and updated.    Objective:   Today's Vitals   05/28/17 0911  BP: (!) 148/78  Pulse: 90  Resp: 14  Temp: 98.6 F (37 C)  TempSrc: Oral  SpO2: 98%  Weight: 111 lb 6.4 oz (50.5 kg)    Wt Readings from Last 3 Encounters:  05/28/17 111 lb 6.4 oz (50.5 kg)    Physical Exam  Constitutional: She is oriented to person, place, and time. She appears well-developed.  HENT:  Head: Normocephalic and atraumatic.  Eyes: Pupils are equal, round, and reactive to light. Right eye exhibits discharge. Left eye exhibits discharge. Left eye exhibits abnormal extraocular motion.  Neck: Normal range of motion. Neck supple.  Cardiovascular: Normal rate and regular rhythm.  Pulmonary/Chest: Effort normal and breath sounds normal.  Abdominal: Soft. Bowel sounds are normal.  Musculoskeletal: Normal range of motion.  Neurological: She is alert and oriented to person, place, and time.  Skin: Skin is warm and dry.   Psychiatric: She has a normal mood and affect. Her behavior is normal. Judgment and thought content normal.   Assessment & Plan:  1. Type 2 diabetes mellitus with complication, without long-term current use of insulin ([place]), A1C 13.3. Continue Lantus 20 units at bedtime with a high protein snack.  Continue short acting insulin 3 units 3 times daily with meals.  Due to renal function will not prescribe metformin. Obtaining microalbumin/Creatinine Ratio, Urine. Checking a BMP today.   2. Stage 3 chronic kidney disease ([place]), last creatinine 1.30, BUN 31, GFR 50. Patient warrants a follow-up with nephrology, however she is uninsured. Will place referral and patient can make the decision whether to follow-up.   3. Iron deficiency anemia, unspecified iron deficiency anemia type, repeat CBC and add  Iron, TIBC and Ferritin Panel  4. Hypertension, Essential, elevated today, not at goal. Only took medication prior to arrival at appointment.  Ankle edema present since recent hospitalization.? Amlodipine or CKD? GFR >30, will trial adding  low dose hydrochlorothiazide to relieve bilateral ankle edema   Meds ordered this encounter  Medications  . amLODipine (NORVASC) 10 MG tablet    Sig: Take 1 tablet (10 mg total) by mouth daily.    Dispense:  30 tablet    Refill:  0    Patient to pick up when ready    Order Specific Question:   Supervising Provider    Answer:   Tresa Garter [[id number]]  . insulin aspart (NOVOLOG) 100 UNIT/ML injection    Sig: Inject 3 Units into the skin 3 (three) times daily with meals.    Dispense:  10 mL    Refill:  11    Patient will pick up medication when ready    Order Specific Question:   Supervising Provider    Answer:   Tresa Garter [[id number]]  . insulin glargine (LANTUS) 100 UNIT/ML injection    Sig: Inject 0.2 mLs (20 Units total) into the skin daily.    Dispense:  10 mL    Refill:  11    Patient will pick up medication when ready    Order Specific  Question:   Supervising Provider    Answer:   Tresa Garter [[id number]]  . Insulin Syringe-Needle U-100 (INSULIN SYRINGE .5CC/31GX5/16") 31G X 5/16" 0.5 ML MISC    Sig: Inject 3 strips as directed as needed.    Dispense:  100 each    Refill:  1    Patient will pick-up medications when ready    Order Specific Question:   Supervising Provider    Answer:   Tresa Garter [id number]  . levETIRAcetam (KEPPRA) 500 MG tablet    Sig: Take 1 tablet (500 mg total) by mouth 2 (two) times daily.    Dispense:  60 tablet    Refill:  1    Patient will pick-up when ready    Order Specific Question:   Supervising Provider    Answer:   Tresa Garter [[id number]]  . hydrochlorothiazide (MICROZIDE) 12.5 MG capsule    Sig: Take 1 capsule (12.5 mg total) by mouth daily.    Dispense:  90 capsule    Refill:  1    Order Specific Question:   Supervising Provider    Answer:   Tresa Garter [id number]     Orders Placed This Encounter  Procedures  . Glucose, capillary  . Microalbumin/Creatinine Ratio, Urine  . Iron, TIBC and Ferritin Panel  . CBC with Differential  . Basic metabolic panel  . POCT urinalysis dip (device)     RTC: 6 weeks for diabetes and hypertension follow-up.    Carroll Sage. Kenton Kingfisher, MSN, FNP-C [facility]  [address] [place] [place], [place] [address] [phone]

## 2017-05-29 LAB — BASIC METABOLIC PANEL
BUN/Creatinine Ratio: 29 — ABNORMAL HIGH (ref 12–28)
BUN: 44 mg/dL — ABNORMAL HIGH (ref 8–27)
CALCIUM: 9.1 mg/dL (ref 8.7–10.3)
CO2: 21 mmol/L (ref 20–29)
Chloride: 101 mmol/L (ref 96–106)
Creatinine, Ser: 1.54 mg/dL — ABNORMAL HIGH (ref 0.57–1.00)
GFR calc Af Amer: 41 mL/min/{1.73_m2} — ABNORMAL LOW (ref >59)
GFR calc non Af Amer: 36 mL/min/{1.73_m2} — ABNORMAL LOW (ref >59)
Glucose: 218 mg/dL — ABNORMAL HIGH (ref 65–99)
POTASSIUM: 4.6 mmol/L (ref 3.5–5.2)
Sodium: 137 mmol/L (ref 134–144)

## 2017-05-29 LAB — IRON,TIBC AND FERRITIN PANEL
Ferritin: 850 ng/mL — ABNORMAL HIGH (ref 15–150)
IRON SATURATION: 28 % (ref 15–55)
IRON: 67 ug/dL (ref 27–139)
Total Iron Binding Capacity: 240 ug/dL — ABNORMAL LOW (ref 250–450)
UIBC: 173 ug/dL (ref 118–369)

## 2017-05-29 LAB — CBC WITH DIFFERENTIAL/PLATELET
Basophils Absolute: 0 10*3/uL (ref 0.0–0.2)
Basos: 1 %
EOS (ABSOLUTE): 0 10*3/uL (ref 0.0–0.4)
Eos: 0 %
Hematocrit: 29.4 % — ABNORMAL LOW (ref 34.0–46.6)
Hemoglobin: 9.1 g/dL — ABNORMAL LOW (ref 11.1–15.9)
IMMATURE GRANULOCYTES: 0 %
Immature Grans (Abs): 0 10*3/uL (ref 0.0–0.1)
Lymphocytes Absolute: 2.3 10*3/uL (ref 0.7–3.1)
Lymphs: 37 %
MCH: 27.5 pg (ref 26.6–33.0)
MCHC: 31 g/dL — ABNORMAL LOW (ref 31.5–35.7)
MCV: 89 fL (ref 79–97)
MONOS ABS: 0.2 10*3/uL (ref 0.1–0.9)
Monocytes: 3 %
NEUTROS PCT: 59 %
Neutrophils Absolute: 3.6 10*3/uL (ref 1.4–7.0)
PLATELETS: 462 10*3/uL — AB (ref 150–379)
RBC: 3.31 x10E6/uL — AB (ref 3.77–5.28)
RDW: 14.4 % (ref 12.3–15.4)
WBC: 6.2 10*3/uL (ref 3.4–10.8)

## 2017-05-29 LAB — MICROALBUMIN / CREATININE URINE RATIO
Creatinine, Urine: 43.6 mg/dL
MICROALBUM., U, RANDOM: 802.3 ug/mL
Microalb/Creat Ratio: 1840.1 mg/g creat — ABNORMAL HIGH (ref 0.0–30.0)

## 2017-06-02 ENCOUNTER — Telehealth: Payer: Self-pay | Admitting: Family Medicine

## 2017-06-02 DIAGNOSIS — N183 Chronic kidney disease, stage 3 unspecified: Secondary | ICD-10-CM

## 2017-06-02 NOTE — Telephone Encounter (Signed)
Notify patient that her renal function remains abnormal. I am referring her nephrology for further evaluation of renal function.

## 2017-06-03 NOTE — Telephone Encounter (Signed)
Patient notified and will wait for a call from [place]

## 2017-06-03 NOTE — Telephone Encounter (Signed)
Left a vm for patient to callback 

## 2017-06-13 ENCOUNTER — Telehealth: Payer: Self-pay

## 2017-06-13 DIAGNOSIS — E118 Type 2 diabetes mellitus with unspecified complications: Secondary | ICD-10-CM

## 2017-06-13 MED ORDER — ACCU-CHEK SOFTCLIX LANCETS MISC: 3.0000 | Freq: Every day | 3 refills | Status: DC

## 2017-06-13 NOTE — Telephone Encounter (Signed)
Strips sent to pharmacy

## 2017-06-19 ENCOUNTER — Telehealth: Payer: Self-pay

## 2017-06-19 DIAGNOSIS — E118 Type 2 diabetes mellitus with unspecified complications: Secondary | ICD-10-CM

## 2017-06-19 MED ORDER — ACCU-CHEK SOFTCLIX LANCETS MISC: 3.0000 | Freq: Every day | 3 refills | Status: AC

## 2017-06-19 MED ORDER — GLUCOSE BLOOD VI STRP: ORAL_STRIP | 12 refills | Status: DC

## 2017-06-19 MED FILL — TRUE METRIX TEST STRIP: 25 days supply | Qty: 100 | Fill #0

## 2017-06-21 ENCOUNTER — Other Ambulatory Visit: Payer: Self-pay

## 2017-06-21 ENCOUNTER — Other Ambulatory Visit: Payer: Self-pay | Admitting: Family Medicine

## 2017-06-21 MED ORDER — BLOOD GLUCOSE MONITOR KIT: PACK | 0 refills | Status: DC

## 2017-06-21 NOTE — Progress Notes (Signed)
Faxed order for blood glucose kit and lancets.   Carroll Sage. Kenton Kingfisher, MSN, FNP-C [facility]  [address] [place], [place] [address] [phone]

## 2017-06-25 ENCOUNTER — Ambulatory Visit (INDEPENDENT_AMBULATORY_CARE_PROVIDER_SITE_OTHER): Payer: Self-pay | Admitting: Family Medicine

## 2017-06-25 ENCOUNTER — Encounter: Payer: Self-pay | Admitting: Family Medicine

## 2017-06-25 VITALS — BP 130/78 | HR 94 | Temp 97.9°F | Resp 14 | Ht 61.02 in | Wt 111.6 lb

## 2017-06-25 DIAGNOSIS — E785 Hyperlipidemia, unspecified: Secondary | ICD-10-CM

## 2017-06-25 DIAGNOSIS — I10 Essential (primary) hypertension: Secondary | ICD-10-CM

## 2017-06-25 DIAGNOSIS — E118 Type 2 diabetes mellitus with unspecified complications: Secondary | ICD-10-CM

## 2017-06-25 DIAGNOSIS — Z1321 Encounter for screening for nutritional disorder: Secondary | ICD-10-CM

## 2017-06-25 LAB — POCT URINALYSIS DIP (DEVICE)
BILIRUBIN URINE: NEGATIVE
Glucose, UA: NEGATIVE mg/dL
KETONES UR: NEGATIVE mg/dL
LEUKOCYTES UA: NEGATIVE
Nitrite: NEGATIVE
Protein, ur: >=300 mg/dL — AB
SPECIFIC GRAVITY, URINE: 1.015 (ref 1.005–1.030)
Urobilinogen, UA: 0.2 mg/dL (ref 0.0–1.0)
pH: 5 (ref 5.0–8.0)

## 2017-06-25 LAB — GLUCOSE, CAPILLARY: Glucose-Capillary: 91 mg/dL (ref 65–99)

## 2017-06-25 MED ORDER — AMLODIPINE BESYLATE 10 MG PO TABS: 10.0000 mg | ORAL_TABLET | Freq: Every day | ORAL | 0 refills | Status: AC

## 2017-06-25 MED ORDER — CLONIDINE HCL 0.1 MG PO TABS
0.1000 mg | ORAL_TABLET | Freq: Once | ORAL | Status: AC
  Administered 2017-06-25: 0.1 mg via ORAL

## 2017-06-25 MED ORDER — "INSULIN SYRINGE 31G X 5/16"" 0.5 ML MISC": 3.0000 | 3 refills | Status: AC | PRN

## 2017-06-25 MED ORDER — BLOOD GLUCOSE MONITOR KIT: PACK | 0 refills | Status: AC

## 2017-06-25 MED ORDER — LEVETIRACETAM 500 MG PO TABS: 500.0000 mg | ORAL_TABLET | Freq: Two times a day (BID) | ORAL | 1 refills | Status: AC

## 2017-06-25 MED ORDER — GLUCOSE BLOOD VI STRP: ORAL_STRIP | 12 refills | Status: AC

## 2017-06-25 MED ORDER — HYDROCHLOROTHIAZIDE 12.5 MG PO CAPS: 12.5000 mg | ORAL_CAPSULE | Freq: Every day | ORAL | 1 refills | Status: AC

## 2017-06-25 MED ORDER — INSULIN GLARGINE 100 UNIT/ML ~~LOC~~ SOLN: 20.0000 [IU] | Freq: Every day | SUBCUTANEOUS | 3 refills | Status: AC

## 2017-06-25 MED ORDER — INSULIN ASPART 100 UNIT/ML ~~LOC~~ SOLN: 1.0000 [IU] | Freq: Three times a day (TID) | SUBCUTANEOUS | 0 refills | Status: AC

## 2017-06-25 MED FILL — ?AMLODIPINE BESYLATE 10MG T: 10 | 30 days supply | Qty: 30 | Fill #0

## 2017-06-25 MED FILL — !TRUE METRIX BLOOD GLUCOSE: 365 days supply | Qty: 1 | Fill #0

## 2017-06-25 MED FILL — TRUEplus LANCETS 28G MISC: 25 days supply | Qty: 100 | Fill #0

## 2017-06-25 MED FILL — ?HYDROCHLOROTHIAZIDE 12.5MG: 12.5 | 30 days supply | Qty: 30 | Fill #0

## 2017-06-25 MED FILL — !NOVOLOG 100UNITS/ML VIAL: 100/ML | 28 days supply | Qty: 10 | Fill #0

## 2017-06-25 MED FILL — !LANTUS 100 UNITS/ML VIAL: 100 | 28 days supply | Qty: 10 | Fill #0

## 2017-06-25 MED FILL — levETIRAcetam 500 MG TABS: 500 | 30 days supply | Qty: 60 | Fill #0

## 2017-06-25 NOTE — Progress Notes (Signed)
Patient ID: Linda Dennis, female    DOB: Dec 14, 1955, 62 y.o.   MRN: [id number]  PCP: Scot Jun, FNP  Chief Complaint  Patient presents with  . Follow-up    6 weeks on diabetes and htn  . Medication Refill    4 month supply patient is going back home    Subjective:  HPI Linda Dennis is a 62 y.o. female with uncontrolled diabetes, hypertension, CKD 3, cataracts (bilateral eyes), presents for 1 months follow-up of chronic conditions and fasting labs.  Linda Dennis is accompanied today by her grandson. She will be returning to [place] in 1 week for 4 months and then plans to return here to the [place] for medical follow-up. Today her grandson reports that her diabetes has been very well controlled. All glucose readings have remained consistently below 150 since starting new medication regimen. Last A1C 13.3. She is not checking her blood pressure although denies chest pain, shortness of breath, or dizziness. Social History   Socioeconomic History  . Marital status: Single    Spouse name: Not on file  . Number of children: Not on file  . Years of education: Not on file  . Highest education level: Not on file  Social Needs  . Financial resource strain: Not on file  . Food insecurity - worry: Not on file  . Food insecurity - inability: Not on file  . Transportation needs - medical: Not on file  . Transportation needs - non-medical: Not on file  Occupational History  . Not on file  Tobacco Use  . Smoking status: Never Smoker  . Smokeless tobacco: Never Used  Substance and Sexual Activity  . Alcohol use: No    Frequency: Never  . Drug use: No  . Sexual activity: Not on file  Other Topics Concern  . Not on file  Social History Narrative  . Not on file    Family History  Family history unknown: Yes    Review of Systems  Constitutional: Negative.   Eyes: Positive for visual disturbance.  Respiratory: Negative.   Cardiovascular: Negative.   Endocrine: Negative.    Genitourinary: Negative.   Musculoskeletal: Negative.   Neurological: Negative.  Negative for seizures.       Tolerating Keppra  Psychiatric/Behavioral: Negative.      Patient Active Problem List   Diagnosis Date Noted  . Hypertensive urgency 05/15/2017  . Uncontrolled type 2 diabetes mellitus with hyperglycemia ([place]) 05/15/2017  . Normocytic normochromic anemia 05/15/2017  . Altered mental status    No Known Allergies  Prior to Admission medications   Medication Sig Start Date End Date Taking? Authorizing Provider  ACCU-CHEK SOFTCLIX LANCETS lancets 3 each by Other route daily. 06/19/17  Yes Scot Jun, FNP  amLODipine (NORVASC) 10 MG tablet Take 1 tablet (10 mg total) by mouth daily. 05/28/17  Yes Scot Jun, FNP  blood glucose meter kit and supplies KIT Dispense based on patient and insurance preference. Use up to four times daily as directed. (FOR ICD-10 E.11.9) 06/21/17  Yes Scot Jun, FNP  glucose blood test strip Use as instructed 06/19/17  Yes Scot Jun, FNP  hydrochlorothiazide (MICROZIDE) 12.5 MG capsule Take 1 capsule (12.5 mg total) by mouth daily. 05/28/17  Yes Scot Jun, FNP  insulin aspart (NOVOLOG) 100 UNIT/ML injection Inject 3 Units into the skin 3 (three) times daily with meals. 05/28/17  Yes Scot Jun, FNP  insulin glargine (LANTUS) 100 UNIT/ML injection Inject 0.2 mLs (20 Units  total) into the skin daily. 05/28/17  Yes Scot Jun, FNP  Insulin Syringe-Needle U-100 (INSULIN SYRINGE .5CC/31GX5/16") 31G X 5/16" 0.5 ML MISC Inject 3 strips as directed as needed. 05/28/17  Yes Scot Jun, FNP  levETIRAcetam (KEPPRA) 500 MG tablet Take 1 tablet (500 mg total) by mouth 2 (two) times daily. 05/28/17  Yes Scot Jun, FNP    Past Medical, Surgical Family and Social History reviewed and updated.    Objective:   Today's Vitals   06/25/17 0802  BP: (!) 160/90  Pulse: 94  Resp: 14  Temp: 97.9 F (36.6 C)   TempSrc: Oral  SpO2: 100%  Weight: 111 lb 9.6 oz (50.6 kg)  Height: 5' 1.02" (1.55 m)    Wt Readings from Last 3 Encounters:  06/25/17 111 lb 9.6 oz (50.6 kg)  05/28/17 111 lb 6.4 oz (50.5 kg)    Physical Exam  Constitutional: She is oriented to person, place, and time. She appears cachectic. She appears ill.  Eyes: EOM are normal. Right eye exhibits discharge. Left eye exhibits no discharge.  Neck: Normal range of motion. Neck supple.  Cardiovascular: Normal rate, regular rhythm, normal heart sounds and intact distal pulses.  Pulmonary/Chest: Effort normal and breath sounds normal.  Abdominal: Soft. Bowel sounds are normal.  Musculoskeletal: Normal range of motion.  Neurological: She is alert and oriented to person, place, and time.  Skin: Skin is warm and dry.  Psychiatric: She has a normal mood and affect. Her behavior is normal. Judgment and thought content normal.     Assessment & Plan:  1. Type 2 diabetes mellitus with complication, without long-term current use of insulin ([place]), last A1C 13.3. Family reports recent episodes of low blood sugars. Will decrease short-acting insulin 1 unit, TID with meals.  Continue Lantus at current doses. Re-educated "do not administer" insulin for blood sugars less than 100.   2. Essential hypertension, uncontrolled today-pt admits to skipping medications today. Will administer one dose of clonidine 0.1 mg once.  3. Hyperlipidemia, unspecified hyperlipidemia type, no recent  Lipid panel on file. Checking fasting  Lipid panel and TSH.  4. Encounter for vitamin deficiency screening,  VITAMIN D 25 Hydroxy (Vit-D Deficiency, Fractures)   Patient is returning to [place] for 4 months and she receive enough medications until she returns. Patient was advised that if she goes beyond 4 months, she will need to see a HCP in [place] to evaluate her chronic conditions as additional refills will not be permitted.    Orders Placed This Encounter   Procedures  . Glucose, capillary  . Lipid panel  . TSH  . VITAMIN D 25 Hydroxy (Vit-D Deficiency, Fractures)  . POCT urinalysis dip (device)    Meds ordered this encounter  Medications  . levETIRAcetam (KEPPRA) 500 MG tablet    Sig: Take 1 tablet (500 mg total) by mouth 2 (two) times daily.    Dispense:  240 tablet    Refill:  1    Patient will pick-up when ready    Order Specific Question:   Supervising Provider    Answer:   Tresa Garter [[id number]]  . insulin glargine (LANTUS) 100 UNIT/ML injection    Sig: Inject 0.2 mLs (20 Units total) into the skin daily.    Dispense:  10 mL    Refill:  3    Patient will pick up medication when ready    Order Specific Question:   Supervising Provider    Answer:   Doreene Burke,  OLUGBEMIGA E [[id number]]  . insulin aspart (NOVOLOG) 100 UNIT/ML injection    Sig: Inject 1 Units into the skin 3 (three) times daily with meals.    Dispense:  10 mL    Refill:  0    Patient will pick up medication when ready    Order Specific Question:   Supervising Provider    Answer:   Tresa Garter [[id number]]  . amLODipine (NORVASC) 10 MG tablet    Sig: Take 1 tablet (10 mg total) by mouth daily.    Dispense:  120 tablet    Refill:  0    Patient to pick up when ready    Order Specific Question:   Supervising Provider    Answer:   Tresa Garter [[id number]]  . hydrochlorothiazide (MICROZIDE) 12.5 MG capsule    Sig: Take 1 capsule (12.5 mg total) by mouth daily.    Dispense:  120 capsule    Refill:  1    Order Specific Question:   Supervising Provider    Answer:   Tresa Garter [id number]  . blood glucose meter kit and supplies KIT    Sig: Dispense based on patient and insurance preference. Use up to four times daily as directed. (FOR ICD-10 E.11.9)    Dispense:  1 each    Refill:  0    Order Specific Question:   Supervising Provider    Answer:   Tresa Garter [id number]    Order Specific Question:   Number of strips    Answer:    300    Order Specific Question:   Number of lancets    Answer:   024  . glucose blood test strip    Sig: Use as instructed    Dispense:  300 each    Refill:  12    Order Specific Question:   Supervising Provider    Answer:   Tresa Garter [[id number]]  . Insulin Syringe-Needle U-100 (INSULIN SYRINGE .5CC/31GX5/16") 31G X 5/16" 0.5 ML MISC    Sig: Inject 3 strips as directed as needed.    Dispense:  300 each    Refill:  3    Patient will pick-up medications when ready    Order Specific Question:   Supervising Provider    Answer:   Tresa Garter [id number]  . cloNIDine (CATAPRES) tablet 0.1 mg    RTC: 4 months repeat A1C and chronic condition management.     Carroll Sage. Kenton Kingfisher, MSN, FNP-C [facility]  [address] [place], [place] [address] [phone]

## 2017-06-26 ENCOUNTER — Telehealth: Payer: Self-pay | Admitting: Family Medicine

## 2017-06-26 LAB — VITAMIN D 25 HYDROXY (VIT D DEFICIENCY, FRACTURES): VIT D 25 HYDROXY: 15.3 ng/mL — AB (ref 30.0–100.0)

## 2017-06-26 LAB — LIPID PANEL
CHOL/HDL RATIO: 3.2 ratio (ref 0.0–4.4)
Cholesterol, Total: 239 mg/dL — ABNORMAL HIGH (ref 100–199)
HDL: 74 mg/dL (ref >39)
LDL CALC: 141 mg/dL — AB (ref 0–99)
Triglycerides: 118 mg/dL (ref 0–149)
VLDL CHOLESTEROL CAL: 24 mg/dL (ref 5–40)

## 2017-06-26 LAB — TSH: TSH: 2.72 u[IU]/mL (ref 0.450–4.500)

## 2017-06-26 MED ORDER — VITAMIN D (ERGOCALCIFEROL) 1.25 MG (50000 UNIT) PO CAPS: 50000.0000 [IU] | ORAL_CAPSULE | ORAL | 0 refills | Status: AC

## 2017-06-26 MED ORDER — ATORVASTATIN CALCIUM 40 MG PO TABS: 40.0000 mg | ORAL_TABLET | Freq: Every day | ORAL | 3 refills | Status: AC

## 2017-06-26 NOTE — Telephone Encounter (Signed)
Contact patient to advise lipid panel indicates abnormal elevated total cholesterol and LDL (bad cholesterol). I am starting her on atorvastatin 40 mg once daily at 6:00 pm with dinner. Vitamin D level is also low, she will start Vitamin D replacement at 50,000 international units.

## 2017-06-26 NOTE — Telephone Encounter (Signed)
Contact patient to advise lipid panel indicates abnormal elevated total cholesterol and LDL (bad cholesterol). I am starting her on atorvastatin 40 mg once daily at 6:00 pm with dinner. Vitamin D level is also low-start ergoca

## 2017-06-27 NOTE — Telephone Encounter (Signed)
Patient notified

## 2017-07-11 ENCOUNTER — Other Ambulatory Visit: Payer: Self-pay | Admitting: Family Medicine

## 2017-07-11 NOTE — Progress Notes (Signed)
erroneous

## 2017-08-15 ENCOUNTER — Telehealth: Payer: Self-pay

## 2017-08-15 NOTE — Telephone Encounter (Signed)
[facility] tried to schedule patient but she is out of the country for the next 2 months. Patient will callback to schedule once she is back per Jeani Hawking.

## 2017-08-16 MED FILL — LANTUS 100 UNITS/ML VIAL: 100 | 28 days supply | Qty: 10 | Fill #1

## 2017-08-16 MED FILL — AMLODIPINE BESYLATE 10 MG T: 10 | 30 days supply | Qty: 30 | Fill #1

## 2017-08-16 MED FILL — TRUE METRIX TEST STRIP: 25 days supply | Qty: 100 | Fill #1

## 2017-08-16 MED FILL — HYDROCHLOROTHIAZIDE 12.5 MG: 12.5 | 30 days supply | Qty: 30 | Fill #1

## 2017-11-11 MED FILL — LANTUS 100 UNITS/ML VIAL: 100 | 28 days supply | Qty: 10 | Fill #2

## 2017-11-11 MED FILL — TRUE METRIX TEST STRIP: 75 days supply | Qty: 300 | Fill #2

## 2017-11-11 MED FILL — ATORVASTATIN CALCIUM 40 MG: 40 | 90 days supply | Qty: 90 | Fill #0

## 2017-11-11 MED FILL — HYDROCHLOROTHIAZIDE 12.5 MG: 12.5 | 90 days supply | Qty: 90 | Fill #2

## 2017-11-11 MED FILL — AMLODIPINE BESYLATE 10 MG T: 10 | 30 days supply | Qty: 30 | Fill #2

## 2018-05-17 IMAGING — CT CT HEAD W/O CM
3 series · 15 of 47 positions shown, 18 images · non-contrast
Comparison: None.

CLINICAL DATA: Headache beginning this morning. Confusion. History
of stroke and diabetes.

EXAM:
CT HEAD WITHOUT CONTRAST
TECHNIQUE: Contiguous axial images were obtained from the base of the skull
through the vertex without intravenous contrast.

[Series 3: head 5.0 h30s · axial · 0.40mm/px · z∈[-124,+1]mm · 9 of 30 slices shown, 12 images]
[im 3/30  brain]
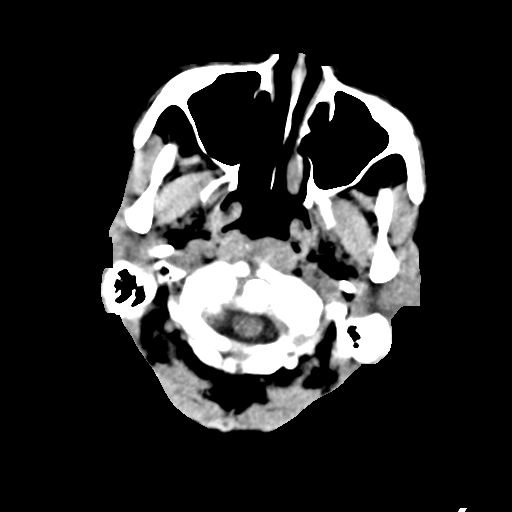
[im 3/30  bone]
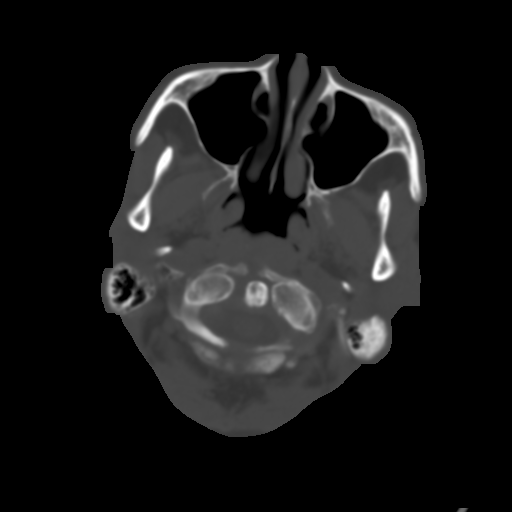
[im 6/30  brain]
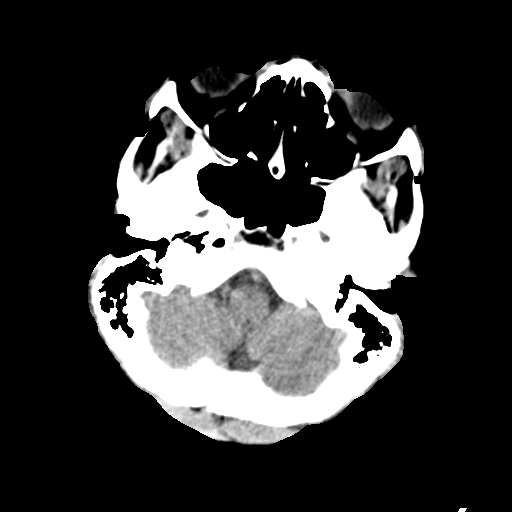
[im 9/30  brain]
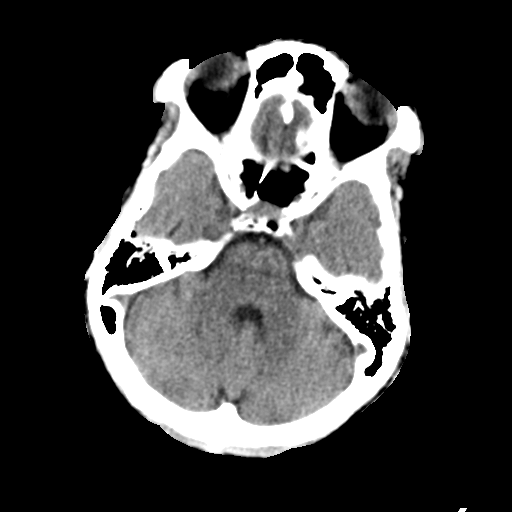
[im 12/30  brain]
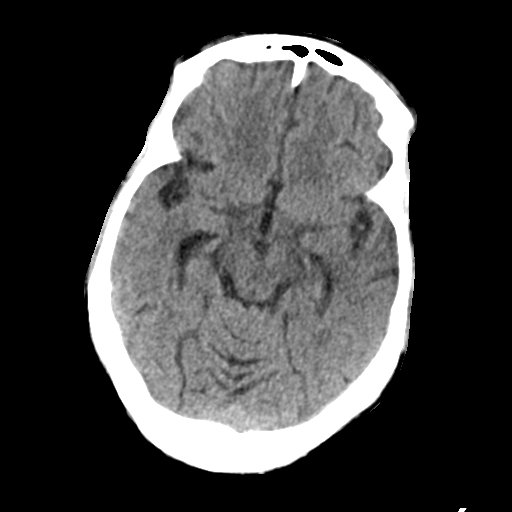
[im 16/30  brain]
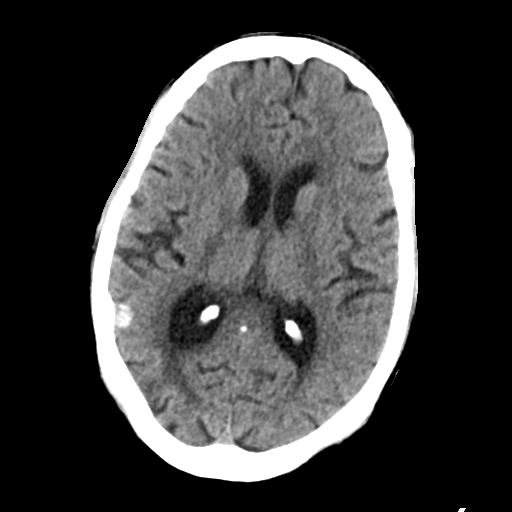
[im 16/30  bone]
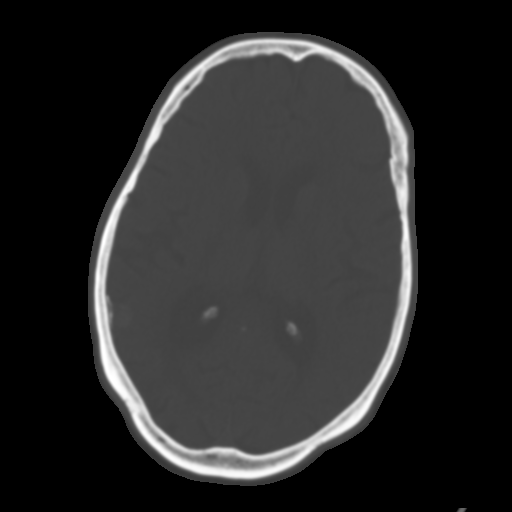
[im 19/30  brain]
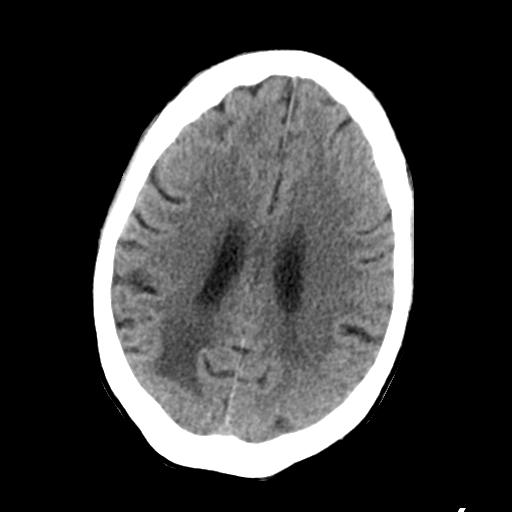
[im 22/30  brain]
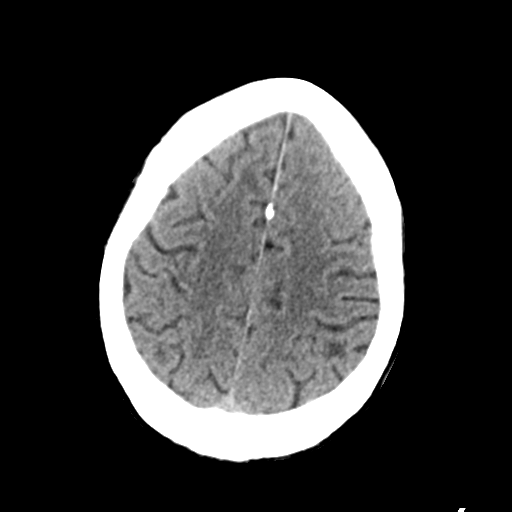
[im 25/30  brain]
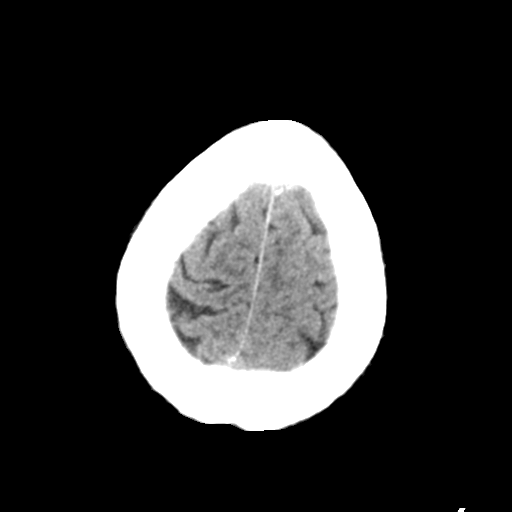
[im 28/30  brain]
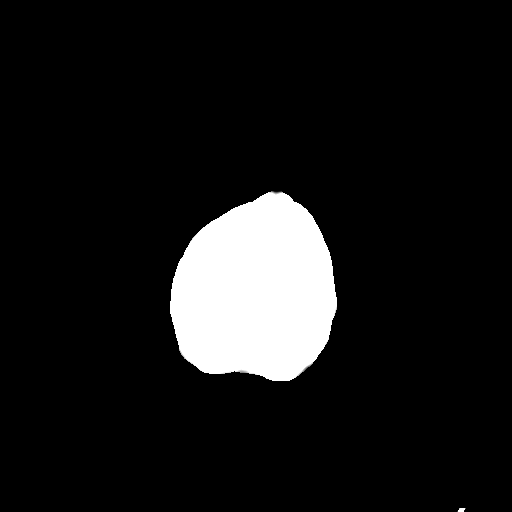
[im 28/30  bone]
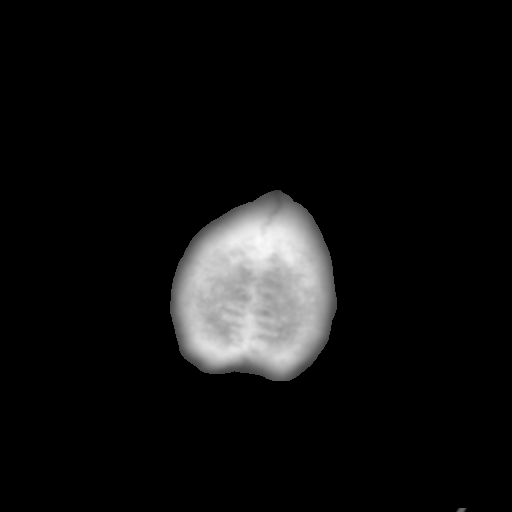

[Series 5: head 3.0 mpr cor · coronal · 0.30mm/px · 3 of 67 slices shown]
[im 23/67  brain]
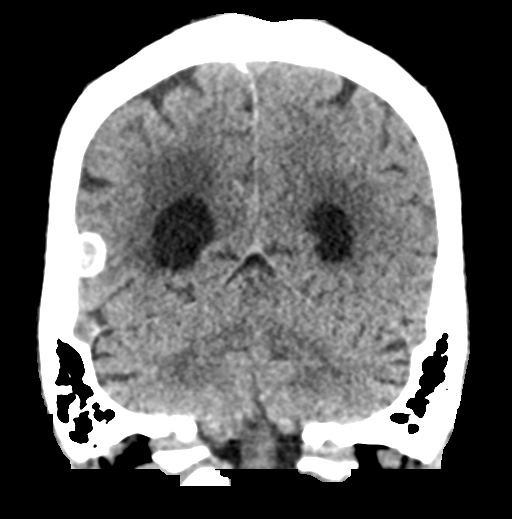
[im 30/67  brain]
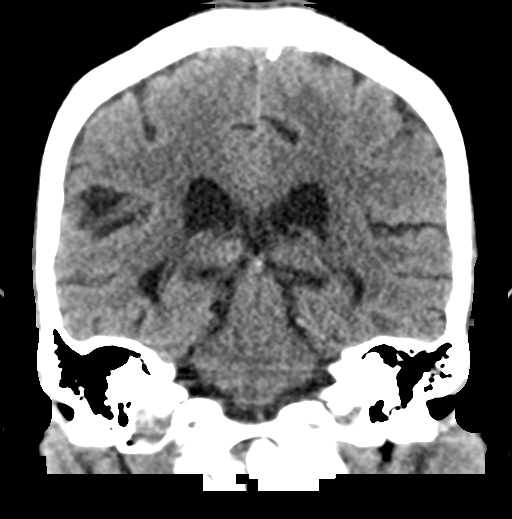
[im 37/67  brain]
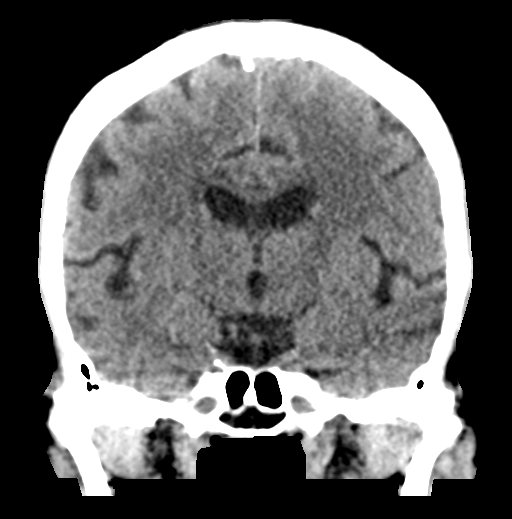

[Series 6: head 3.0 mpr sag · sagittal · 0.30mm/px · 3 of 51 slices shown]
[im 17/51  brain]
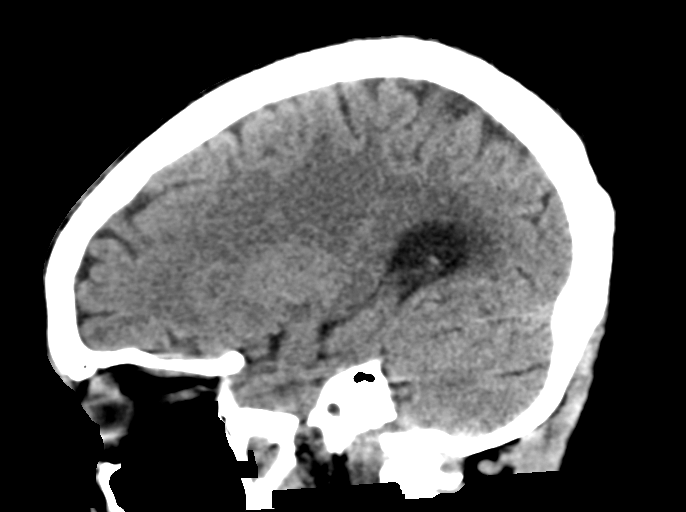
[im 26/51  brain]
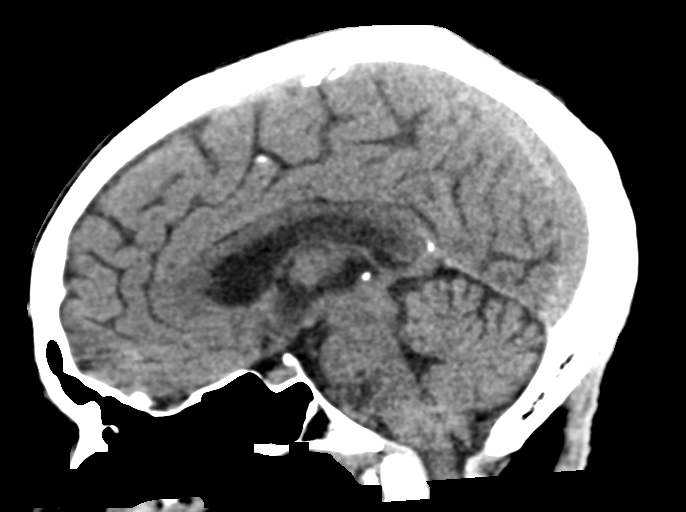
[im 34/51  brain]
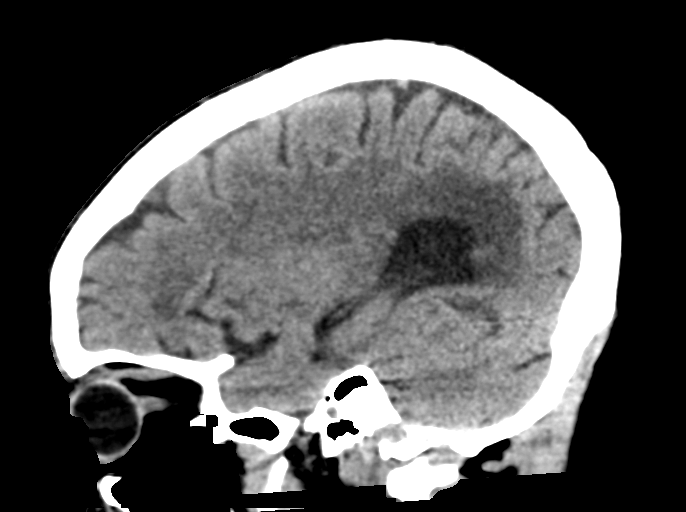

[15 of 47 positions shown; findings below may reference images not displayed]

FINDINGS: Brain: Mild cerebral atrophy. No ventricular dilatation.
Low-attenuation changes in the deep white matter are nonspecific but
likely due to small vessel ischemia. Focal areas of low-attenuation
demonstrated in a right posterior parietal lobe and left frontal and
parietal lobes. These could represent old infarcts but margins are
not well defined and acute infarct and 1 or more locations could
also be present. MRI would be more sensitive for evaluation of acute
ischemia. No mass effect or midline shift. No abnormal extra-axial
fluid collections. Gray-white matter junctions are distinct. Basal
cisterns are not effaced. No acute intracranial hemorrhage. There is
a nodular extra-axial calcification along the right posterior
parietal dural surface measuring 1.5 cm diameter, likely
representing a calcified meningioma.

Vascular: Internal carotid artery vascular calcifications are
present.

Skull: Normal. Negative for fracture or focal lesion.

Sinuses/Orbits: No acute finding.

Other: Congenital nonunion of the anterior and posterior arch of C1.
IMPRESSION: 1. Focal areas of low-attenuation in the right posterior parietal
lobe and left frontal and parietal lobes. These could represent
acute or chronic ischemic changes. MRI suggested for more definitive
evaluation if clinically indicated.
2. No acute intracranial hemorrhage.
3. Mild cerebral atrophy and small vessel ischemic changes.
4. 15 mm right parietal calcified meningioma.

## 2018-09-18 NOTE — Telephone Encounter (Signed)
Message sent to provider
